# Patient Record
Sex: Female | Born: 1990 | Race: White | Hispanic: No | Marital: Single | Smoking: Current every day smoker
Health system: Southern US, Community
[De-identification: ages and names within clinical notes are randomized; demographics above are authoritative.]

## PROBLEM LIST (undated history)

## (undated) DIAGNOSIS — F32A Depression, unspecified: Secondary | ICD-10-CM

## (undated) DIAGNOSIS — Z973 Presence of spectacles and contact lenses: Secondary | ICD-10-CM

## (undated) DIAGNOSIS — G4700 Insomnia, unspecified: Secondary | ICD-10-CM

## (undated) DIAGNOSIS — Z862 Personal history of diseases of the blood and blood-forming organs and certain disorders involving the immune mechanism: Secondary | ICD-10-CM

## (undated) DIAGNOSIS — F419 Anxiety disorder, unspecified: Secondary | ICD-10-CM

## (undated) DIAGNOSIS — D369 Benign neoplasm, unspecified site: Secondary | ICD-10-CM

## (undated) DIAGNOSIS — F988 Other specified behavioral and emotional disorders with onset usually occurring in childhood and adolescence: Secondary | ICD-10-CM

## (undated) DIAGNOSIS — F99 Mental disorder, not otherwise specified: Secondary | ICD-10-CM

## (undated) HISTORY — DX: Mental disorder, not otherwise specified: F99

## (undated) HISTORY — PX: WISDOM TOOTH EXTRACTION: SHX21

## (undated) HISTORY — PX: RHINOPLASTY: SUR1284

---

## 2012-06-13 ENCOUNTER — Other Ambulatory Visit: Payer: Self-pay | Admitting: Orthopedic Surgery

## 2012-06-13 DIAGNOSIS — M2390 Unspecified internal derangement of unspecified knee: Secondary | ICD-10-CM

## 2012-06-17 ENCOUNTER — Ambulatory Visit
Admission: RE | Admit: 2012-06-17 | Discharge: 2012-06-17 | Disposition: A | Discharge status: Home / Self Care | Payer: BC Managed Care – PPO | Source: Ambulatory Visit | Attending: Orthopedic Surgery | Admitting: Orthopedic Surgery

## 2012-06-17 DIAGNOSIS — M2390 Unspecified internal derangement of unspecified knee: Secondary | ICD-10-CM

## 2015-06-14 ENCOUNTER — Encounter: Payer: Self-pay | Admitting: Emergency Medicine

## 2015-06-14 ENCOUNTER — Emergency Department (INDEPENDENT_AMBULATORY_CARE_PROVIDER_SITE_OTHER): Payer: Self-pay

## 2015-06-14 ENCOUNTER — Emergency Department
Admission: EM | Admit: 2015-06-14 | Discharge: 2015-06-14 | Disposition: A | Discharge status: Home / Self Care | Payer: Self-pay | Source: Home / Self Care | Attending: Family Medicine | Admitting: Family Medicine

## 2015-06-14 DIAGNOSIS — H04209 Unspecified epiphora, unspecified lacrimal gland: Secondary | ICD-10-CM

## 2015-06-14 DIAGNOSIS — R067 Sneezing: Secondary | ICD-10-CM

## 2015-06-14 DIAGNOSIS — R05 Cough: Secondary | ICD-10-CM

## 2015-06-14 DIAGNOSIS — J069 Acute upper respiratory infection, unspecified: Secondary | ICD-10-CM

## 2015-06-14 DIAGNOSIS — R059 Cough, unspecified: Secondary | ICD-10-CM

## 2015-06-14 MED ORDER — PREDNISONE 20 MG PO TABS: ORAL_TABLET | ORAL | Status: DC

## 2015-06-14 MED ORDER — BENZONATATE 100 MG PO CAPS: 100.0000 mg | ORAL_CAPSULE | Freq: Three times a day (TID) | ORAL | Status: DC

## 2015-06-14 MED ORDER — ALBUTEROL SULFATE HFA 108 (90 BASE) MCG/ACT IN AERS: 1.0000 | INHALATION_SPRAY | Freq: Four times a day (QID) | RESPIRATORY_TRACT | Status: DC | PRN

## 2015-06-14 MED ORDER — FLUTICASONE PROPIONATE 50 MCG/ACT NA SUSP: NASAL | Status: DC

## 2015-06-14 NOTE — ED Provider Notes (Signed)
CSN: [id number]     Arrival date & time 06/14/15  1216 History   First MD Initiated Contact with Patient 06/14/15 1244     Chief Complaint  Patient presents with  . Cough   (Consider location/radiation/quality/duration/timing/severity/associated sxs/prior Treatment) HPI  Pt is a 24yo female presenting to [facility] with c/o mild to moderately productive cough with associated fatigue, body aches, thick yellow-green nasal discharge, and left eye itching and irritation for 6 days.  Pt started getting sick on Thursday, 06/09/15, but symptoms worsened Saturday 06/11/15.  She was started on Amoxicillin but states she is not improving. 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  She also questions if her symptoms are allergic in nature but she has not tried zyrtec which she has taken in the past.  Denies fever, n/v/d. Denies chest pain or SOB. Denies hx of asthma.   History reviewed. No pertinent past medical history. History reviewed. No pertinent past surgical history. No family history on file. Social History  Substance Use Topics  . Smoking status: Never Smoker   . Smokeless tobacco: None  . Alcohol Use: Yes   OB History    No data available     Review of Systems  Constitutional: Positive for appetite change and fatigue. Negative for fever and chills.  HENT: Positive for congestion, rhinorrhea, sinus pressure, sore throat and voice change. Negative for ear pain and trouble swallowing.   Respiratory: Positive for cough. Negative for shortness of breath.   Cardiovascular: Negative for chest pain and palpitations.  Gastrointestinal: Negative for nausea, vomiting, abdominal pain and diarrhea.  Musculoskeletal: Negative for myalgias, back pain and arthralgias.  Skin: Negative for rash.  Neurological: Negative for dizziness, light-headedness and headaches.  All other systems reviewed and are negative.   Allergies   Review of patient's allergies indicates no known allergies.  Home Medications   Prior to Admission medications   Medication Sig Start Date End Date Taking? Authorizing Provider  ALPRAZolam Duanne Moron) 0.5 MG tablet Take 0.5 mg by mouth at bedtime as needed for anxiety.   Yes Historical Provider, MD  amphetamine-dextroamphetamine (ADDERALL) 20 MG tablet Take 20 mg by mouth daily.   Yes Historical Provider, MD  Pseudoeph-Doxylamine-DM-APAP (DAYQUIL/NYQUIL COLD/FLU RELIEF PO) Take by mouth.   Yes Historical Provider, MD  venlafaxine XR (EFFEXOR-XR) 150 MG 24 hr capsule Take 150 mg by mouth daily with breakfast.   Yes Historical Provider, MD  zolpidem (AMBIEN) 10 MG tablet Take 10 mg by mouth at bedtime as needed for sleep.   Yes Historical Provider, MD  albuterol (PROVENTIL HFA;VENTOLIN HFA) 108 (90 BASE) MCG/ACT inhaler Inhale 1-2 puffs into the lungs every 6 (six) hours as needed for wheezing or shortness of breath. 06/14/15   Noland Fordyce, PA-C  benzonatate (TESSALON) 100 MG capsule Take 1 capsule (100 mg total) by mouth every 8 (eight) hours. 06/14/15   Noland Fordyce, PA-C  fluticasone [facility]) 50 MCG/ACT nasal spray 1-2 sprays daily 06/14/15   Noland Fordyce, PA-C  predniSONE (DELTASONE) 20 MG tablet 3 tabs po day one, then 2 po daily x 4 days 06/14/15   Noland Fordyce, PA-C   Meds Ordered and Administered this Visit  Medications - No data to display  BP 134/96 mmHg  Pulse 101  Temp(Src) 98.3 F (36.8 C) (Oral)  Ht 5\' 6"  (1.676 m)  Wt 150 lb (68.04 kg)  BMI 24.22 kg/m2  SpO2 96%  LMP 04/14/2015 (Approximate) No data found.   Physical Exam  Constitutional: She appears well-developed and well-nourished. No distress.  HENT:  Head: Normocephalic and atraumatic.  Right Ear: Hearing, tympanic membrane, external ear and ear canal normal.  Left Ear: Hearing, tympanic membrane, external ear and ear canal normal.  Nose: Mucosal edema present. Right sinus exhibits no maxillary sinus  tenderness and no frontal sinus tenderness. Left sinus exhibits no maxillary sinus tenderness and no frontal sinus tenderness.  Mouth/Throat: Uvula is midline, oropharynx is clear and moist and mucous membranes are normal.  Frequent sneezing during exam  Eyes: Conjunctivae are normal. No scleral icterus.  Neck: Normal range of motion. Neck supple.  Cardiovascular: Normal rate, regular rhythm and normal heart sounds.   Pulmonary/Chest: Effort normal and breath sounds normal. No stridor. No respiratory distress. She has no wheezes. She has no rales. She exhibits no tenderness.  Intermittent mildly productive cough during exam.  Abdominal: Soft. She exhibits no distension and no mass. There is no tenderness. There is no rebound and no guarding.  Musculoskeletal: Normal range of motion.  Lymphadenopathy:    She has no cervical adenopathy.  Neurological: She is alert.  Skin: Skin is warm and dry. She is not diaphoretic.  Psychiatric: Her speech is normal. Her mood appears anxious ( mild).  Nursing note and vitals reviewed.   ED Course  Procedures (including critical care time)  Labs Review Labs Reviewed - No data to display  Imaging Review Dg Chest 2 View  06/14/2015  CLINICAL DATA:  24 year old female with 1 week of productive cough and hoarseness EXAM: CHEST  2 VIEW COMPARISON:  None. FINDINGS: The lungs are clear and negative for focal airspace consolidation, pulmonary edema or suspicious pulmonary nodule. No pleural effusion or pneumothorax. Cardiac and mediastinal contours are within normal limits. No acute fracture or lytic or blastic osseous lesions. The visualized upper abdominal bowel gas pattern is unremarkable. IMPRESSION: Negative chest x-ray. Electronically Signed   By: Jacqulynn Cadet M.D.   On: 06/14/2015 13:05      MDM   1. Acute upper respiratory infection   2. Sneezing with watery eyes   3. Cough    Pt c/o persistent cough despite starting amoxicillin 4 days ago.  Initial symptoms started 6 days ago.   Advised pt symptoms may be viral which is why antibiotic does not seem to be helping, however, may be an allergic component given the sneezing and watery eyes.  To help treat potential underlying bacterial infection and to help prevent bacterial infection, pt may continue to take amoxicillin, however, may also add Tessalon, Flonase, prednisone and albuterol inhaler.  F/u with PCP in 7-10 days if not improving, sooner if worsening.  Patient verbalized understanding and agreement with treatment plan.     Noland Fordyce, PA-C 06/14/15 1426

## 2015-06-14 NOTE — Discharge Instructions (Signed)
You may take 400-600mg  Ibuprofen (Motrin) every 6-8 hours for fever and pain  Alternate with Tylenol  You may take 500mg  Tylenol every 4-6 hours as needed for fever and pain  Follow-up with your primary care provider next week for recheck of symptoms if not improving.  Be sure to drink plenty of fluids and rest, at least 8hrs of sleep a night, preferably more while you are sick. Return urgent care or go to closest ER if you cannot keep down fluids/signs of dehydration, fever not reducing with Tylenol, difficulty breathing/wheezing, stiff neck, worsening condition, or other concerns (see below)  Be sure to complete the amoxicillin as prescribed and be sure to complete entire course even if you start to feel better to ensure infection does not come back.

## 2015-06-14 NOTE — ED Notes (Signed)
Productive cough, hoarseness, fatigue, body aches, heavy, thick yellow-green mucus, left eye irritated x 6 days- Has been taking amoxicillin for 4 days, not getting better

## 2016-02-01 ENCOUNTER — Emergency Department (HOSPITAL_COMMUNITY)
Admission: EM | Admit: 2016-02-01 | Discharge: 2016-02-02 | Disposition: A | Discharge status: Home / Self Care | Payer: Medicaid - Out of State | Attending: Emergency Medicine | Admitting: Emergency Medicine

## 2016-02-01 ENCOUNTER — Encounter (HOSPITAL_COMMUNITY): Payer: Self-pay | Admitting: Emergency Medicine

## 2016-02-01 DIAGNOSIS — K5900 Constipation, unspecified: Secondary | ICD-10-CM | POA: Diagnosis not present

## 2016-02-01 DIAGNOSIS — D270 Benign neoplasm of right ovary: Secondary | ICD-10-CM | POA: Diagnosis not present

## 2016-02-01 DIAGNOSIS — R1031 Right lower quadrant pain: Secondary | ICD-10-CM | POA: Diagnosis present

## 2016-02-01 DIAGNOSIS — Z79899 Other long term (current) drug therapy: Secondary | ICD-10-CM | POA: Insufficient documentation

## 2016-02-01 DIAGNOSIS — D369 Benign neoplasm, unspecified site: Secondary | ICD-10-CM

## 2016-02-01 LAB — COMPREHENSIVE METABOLIC PANEL
ALBUMIN: 4.3 g/dL (ref 3.5–5.0)
ALT: 22 U/L (ref 14–54)
AST: 25 U/L (ref 15–41)
Alkaline Phosphatase: 51 U/L (ref 38–126)
Anion gap: 8 (ref 5–15)
BUN: 11 mg/dL (ref 6–20)
CHLORIDE: 105 mmol/L (ref 101–111)
CO2: 21 mmol/L — AB (ref 22–32)
CREATININE: 0.66 mg/dL (ref 0.44–1.00)
Calcium: 8.7 mg/dL — ABNORMAL LOW (ref 8.9–10.3)
GFR calc Af Amer: >60 mL/min (ref >60)
GFR calc non Af Amer: >60 mL/min (ref >60)
Glucose, Bld: 83 mg/dL (ref 65–99)
Potassium: 3.6 mmol/L (ref 3.5–5.1)
SODIUM: 134 mmol/L — AB (ref 135–145)
Total Bilirubin: 0.4 mg/dL (ref 0.3–1.2)
Total Protein: 7.6 g/dL (ref 6.5–8.1)

## 2016-02-01 LAB — URINALYSIS, ROUTINE W REFLEX MICROSCOPIC
Bilirubin Urine: NEGATIVE
GLUCOSE, UA: NEGATIVE mg/dL
HGB URINE DIPSTICK: NEGATIVE
Ketones, ur: NEGATIVE mg/dL
Leukocytes, UA: NEGATIVE
Nitrite: NEGATIVE
PROTEIN: NEGATIVE mg/dL
Specific Gravity, Urine: 1.024 (ref 1.005–1.030)
pH: 5.5 (ref 5.0–8.0)

## 2016-02-01 LAB — CBC
HCT: 41.4 % (ref 36.0–46.0)
Hemoglobin: 13.2 g/dL (ref 12.0–15.0)
MCH: 26.7 pg (ref 26.0–34.0)
MCHC: 31.9 g/dL (ref 30.0–36.0)
MCV: 83.8 fL (ref 78.0–100.0)
PLATELETS: 327 10*3/uL (ref 150–400)
RBC: 4.94 MIL/uL (ref 3.87–5.11)
RDW: 13.6 % (ref 11.5–15.5)
WBC: 9.4 10*3/uL (ref 4.0–10.5)

## 2016-02-01 LAB — I-STAT BETA HCG BLOOD, ED (MC, WL, AP ONLY): I-stat hCG, quantitative: <5 m[IU]/mL (ref <5)

## 2016-02-01 LAB — LIPASE, BLOOD: LIPASE: 23 U/L (ref 11–51)

## 2016-02-01 NOTE — ED Provider Notes (Signed)
[facility] DEPT Provider Note   CSN: [id number] Arrival date & time: 02/01/16  2057     History   Chief Complaint Chief Complaint  Patient presents with  . Abdominal Pain    HPI Mary Kane is a 25 y.o. female.  HPI Mary Kane is a 25 y.o. female who presents to emergency department with complaint of abdominal pain. Patient states abdominal pain started 2 days ago. Pain is localized to right lower quadrant. She reports associated nausea, urinary frequency and urgency, constipation. She states she hasn't had a bowel movement in several days until today when she "pushed out some hard stool balls." She has tried to take MiraLAX yesterday and today which she states did not help. Patient denies any fever or chills. She denies any back pain. She denies any blood in her stool. She is able to eat and drink normally. No history of prior abdominal surgeries. She reports history of ovarian cysts but states this does not feel like it.  History reviewed. No pertinent past medical history.  There are no active problems to display for this patient.   Past Surgical History:  Procedure Laterality Date  . RHINOPLASTY      OB History    No data available       Home Medications    Prior to Admission medications   Medication Sig Start Date End Date Taking? Authorizing Provider  albuterol (PROVENTIL HFA;VENTOLIN HFA) 108 (90 BASE) MCG/ACT inhaler Inhale 1-2 puffs into the lungs every 6 (six) hours as needed for wheezing or shortness of breath. 06/14/15   Noland Fordyce, PA-C  ALPRAZolam Duanne Moron) 0.5 MG tablet Take 0.5 mg by mouth at bedtime as needed for anxiety.    Historical Provider, MD  amphetamine-dextroamphetamine (ADDERALL) 20 MG tablet Take 20 mg by mouth daily.    Historical Provider, MD  benzonatate (TESSALON) 100 MG capsule Take 1 capsule (100 mg total) by mouth every 8 (eight) hours. 06/14/15   Noland Fordyce, PA-C  fluticasone (FLONASE) 50 MCG/ACT nasal spray 1-2 sprays  daily 06/14/15   Noland Fordyce, PA-C  predniSONE (DELTASONE) 20 MG tablet 3 tabs po day one, then 2 po daily x 4 days 06/14/15   Noland Fordyce, PA-C  Pseudoeph-Doxylamine-DM-APAP (DAYQUIL/NYQUIL COLD/FLU RELIEF PO) Take by mouth.    Historical Provider, MD  venlafaxine XR (EFFEXOR-XR) 150 MG 24 hr capsule Take 150 mg by mouth daily with breakfast.    Historical Provider, MD  zolpidem (AMBIEN) 10 MG tablet Take 10 mg by mouth at bedtime as needed for sleep.    Historical Provider, MD    Family History History reviewed. No pertinent family history.  Social History Social History  Substance Use Topics  . Smoking status: Never Smoker  . Smokeless tobacco: Never Used  . Alcohol use Yes     Allergies   Review of patient's allergies indicates no known allergies.   Review of Systems Review of Systems  Constitutional: Negative for chills and fever.  Respiratory: Negative for cough, chest tightness and shortness of breath.   Cardiovascular: Negative for chest pain, palpitations and leg swelling.  Gastrointestinal: Positive for abdominal pain, constipation and nausea. Negative for blood in stool, diarrhea and vomiting.  Genitourinary: Positive for dysuria, frequency and urgency. Negative for flank pain, pelvic pain, vaginal bleeding, vaginal discharge and vaginal pain.  Musculoskeletal: Negative for arthralgias, myalgias, neck pain and neck stiffness.  Skin: Negative for rash.  Neurological: Negative for dizziness, weakness and headaches.  All other systems reviewed and are negative.  Physical Exam Updated Vital Signs BP 134/96 (BP Location: Left Arm)   Pulse 107   Temp 98.7 F (37.1 C) (Oral)   Resp 18   Ht 5\' 6"  (1.676 m)   Wt 65.8 kg   LMP 01/11/2016 (Approximate)   SpO2 98%   BMI 23.40 kg/m   Physical Exam  Constitutional: She appears well-developed and well-nourished. No distress.  HENT:  Head: Normocephalic.  Eyes: Conjunctivae are normal.  Neck: Neck supple.    Cardiovascular: Normal rate, regular rhythm and normal heart sounds.   Pulmonary/Chest: Effort normal and breath sounds normal. No respiratory distress. She has no wheezes. She has no rales.  Abdominal: Soft. Bowel sounds are normal. She exhibits no distension. There is tenderness. There is no rebound and no guarding.  RLQ tenderness  Musculoskeletal: She exhibits no edema.  Neurological: She is alert.  Skin: Skin is warm and dry.  Psychiatric: She has a normal mood and affect. Her behavior is normal.  Nursing note and vitals reviewed.    ED Treatments / Results  Labs (all labs ordered are listed, but only abnormal results are displayed) Labs Reviewed  COMPREHENSIVE METABOLIC PANEL - Abnormal; Notable for the following:       Result Value   Sodium 134 (*)    CO2 21 (*)    Calcium 8.7 (*)    All other components within normal limits  WET PREP, GENITAL  LIPASE, BLOOD  CBC  URINALYSIS, ROUTINE W REFLEX MICROSCOPIC (NOT AT [facility])  I-STAT BETA HCG BLOOD, ED ([facility], [facility], [facility] ONLY)  GC/CHLAMYDIA PROBE AMP ([place]) NOT AT [facility]    EKG  EKG Interpretation None       Radiology Ct Abdomen Pelvis W Contrast  Result Date: 02/02/2016 CLINICAL DATA:  Right lower quadrant pain, nausea, vomiting, and constipation for 4 days. Urinary frequency. White cell count 9.4. Negative pregnancy test. EXAM: CT ABDOMEN AND PELVIS WITH CONTRAST TECHNIQUE: Multidetector CT imaging of the abdomen and pelvis was performed using the standard protocol following bolus administration of intravenous contrast. CONTRAST:  119mL ISOVUE-300 IOPAMIDOL (ISOVUE-300) INJECTION 61% COMPARISON:  None. FINDINGS: The lung bases are clear. The liver, spleen, gallbladder, pancreas, adrenal glands, kidneys, abdominal aorta, inferior vena cava, and retroperitoneal lymph nodes are unremarkable. Stomach, small bowel, and colon are decompressed. Diffusely stool-filled colon. No free air or free fluid in the abdomen. Pelvis: Bladder  wall is not thickened. Complex fat containing lesion in the right ovary measuring 3.5 cm diameter consistent with a dermoid cyst. Left ovary is unremarkable. No free or loculated pelvic fluid collections. No pelvic lymphadenopathy. The appendix is normal. No destructive bone lesions. 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 No evidence of bowel obstruction or inflammation. Appendix is normal. Electronically Signed   By: Lucienne Capers M.D.   On: 02/02/2016 02:08    Procedures Procedures (including critical care time)  Medications Ordered in ED Medications - No data to display   Initial Impression / Assessment and Plan / ED Course  I have reviewed the triage vital signs and the nursing notes.  Pertinent labs & imaging results that were available during my care of the patient were reviewed by me and considered in my medical decision making (see chart for details).  Clinical Course  Comment By Time  Patient seen and examined. Patient with right lower quadrant pain for 2 days. She is afebrile, mildly tachycardic, otherwise nontoxic appearing. Will give IV fluids, will perform a pelvic exam, labs showing  normal white blood cell count, normal urinalysis, negative pregnancy test, negative LFTs and lipase. Jeannett Senior, PA-C 08/16 2355    CT ordered to RO appendicitis. CT showing persistent right dermoid cyst and constipation. Pt had similar finding on CT  On 09/13/15. At that time cyst was 3.9cm. Advised she will need close OB/GYN follow up. Pt understands. Given magnesium citrate to go home with. miralax daily. Discussed methods to prevent constipation in the future.   Vitals:   02/01/16 2101 02/01/16 2206 02/01/16 2207 02/02/16 0127  BP: 128/82 134/96  129/86  Pulse: 114 107  94  Resp: 20 18  16   Temp: 98.7 F (37.1 C) 98.7 F (37.1 C)    TempSrc: Oral Oral    SpO2: 99% 98%  100%  Weight: 65.8 kg  65.8 kg   Height: 5\' 6"  (1.676 m)  5\' 6"  (1.676 m)        Final Clinical Impressions(s) / ED Diagnoses   Final diagnoses:  Right lower quadrant abdominal pain  Dermoid cyst  Constipation, unspecified constipation type    New Prescriptions Discharge Medication List as of 02/02/2016  2:27 AM    START taking these medications   Details  naproxen (NAPROSYN) 500 MG tablet Take 1 tablet (500 mg total) by mouth 2 (two) times daily., Starting Thu 02/02/2016, Print         Jeannett Senior, PA-C 02/03/16 0132    Orlie Dakin, MD 02/06/16 [phone]

## 2016-02-01 NOTE — ED Triage Notes (Signed)
Pt is c/o right lower quadrant pain that started on Monday and it has been getting worse since  Pt has had nausea without vomiting  Pt states the pain is constant  Pt states she has been really constipated and is only able to pass small hard balls of poop  Pt states she also has urinary frequency

## 2016-02-02 ENCOUNTER — Encounter (HOSPITAL_COMMUNITY): Payer: Self-pay

## 2016-02-02 ENCOUNTER — Emergency Department (HOSPITAL_COMMUNITY): Payer: Medicaid - Out of State

## 2016-02-02 LAB — GC/CHLAMYDIA PROBE AMP (~~LOC~~) NOT AT ARMC
CHLAMYDIA, DNA PROBE: NEGATIVE
NEISSERIA GONORRHEA: NEGATIVE

## 2016-02-02 LAB — WET PREP, GENITAL
Clue Cells Wet Prep HPF POC: NONE SEEN
Sperm: NONE SEEN
Trich, Wet Prep: NONE SEEN
YEAST WET PREP: NONE SEEN

## 2016-02-02 MED ORDER — IOPAMIDOL (ISOVUE-300) INJECTION 61%
100.0000 mL | Freq: Once | INTRAVENOUS | Status: AC | PRN
  Administered 2016-02-02: 100 mL via INTRAVENOUS

## 2016-02-02 MED ORDER — NAPROXEN 500 MG PO TABS: 500.0000 mg | ORAL_TABLET | Freq: Two times a day (BID) | ORAL | 0 refills | Status: DC

## 2016-02-02 MED ORDER — MAGNESIUM CITRATE PO SOLN
1.0000 | Freq: Once | ORAL | Status: DC
  Filled 2016-02-02: qty 296

## 2016-02-02 NOTE — ED Notes (Signed)
Patient is alert and oriented x3.  She was given DC instructions and follow up visit instructions.  Patient gave verbal understanding. She was DC ambulatory under her own power to home.  V/S stable.  He was not showing any signs of distress on DC 

## 2016-02-02 NOTE — Discharge Instructions (Signed)
Take mag citrate on the way home or when you get home. You can also try an enema. Take MiraLAX daily until have daily bowel movements. Please follow with OB/GYN regarding your ovarian cyst.

## 2019-09-07 ENCOUNTER — Ambulatory Visit (HOSPITAL_COMMUNITY)
Admission: AD | Admit: 2019-09-07 | Discharge: 2019-09-07 | Disposition: A | Discharge status: Home / Self Care | Payer: BLUE CROSS/BLUE SHIELD | Attending: Psychiatry | Admitting: Psychiatry

## 2019-09-07 DIAGNOSIS — F331 Major depressive disorder, recurrent, moderate: Secondary | ICD-10-CM | POA: Diagnosis not present

## 2019-09-07 DIAGNOSIS — R45851 Suicidal ideations: Secondary | ICD-10-CM | POA: Diagnosis not present

## 2019-09-07 DIAGNOSIS — F419 Anxiety disorder, unspecified: Secondary | ICD-10-CM | POA: Insufficient documentation

## 2019-09-07 DIAGNOSIS — R450 Nervousness: Secondary | ICD-10-CM | POA: Insufficient documentation

## 2019-09-07 DIAGNOSIS — F4310 Post-traumatic stress disorder, unspecified: Secondary | ICD-10-CM | POA: Insufficient documentation

## 2019-09-07 DIAGNOSIS — G479 Sleep disorder, unspecified: Secondary | ICD-10-CM | POA: Insufficient documentation
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
## 2019-09-08 NOTE — H&P (Signed)
Behavioral Health Medical Screening Exam  Mary Kane is an 29 y.o. female.  Total Time spent with patient: 15 minutes  Psychiatric Specialty Exam: Physical Exam  Constitutional: She is oriented to person, place, and time. She appears well-developed and well-nourished. No distress.  HENT:  Head: Normocephalic and atraumatic.  Right Ear: External ear normal.  Left Ear: External ear normal.  Eyes: Right eye exhibits no discharge. Left eye exhibits no discharge.  Cardiovascular: Normal rate.  Respiratory: Effort normal. No respiratory distress.  Musculoskeletal:        General: Normal range of motion.  Neurological: She is alert and oriented to person, place, and time.  Skin: She is not diaphoretic.  Psychiatric: Her mood appears anxious. She is not withdrawn and not actively hallucinating. Thought content is not paranoid and not delusional. She exhibits a depressed mood. She expresses no homicidal and no suicidal ideation.    Review of Systems  Constitutional: Negative.   HENT: Negative.   Respiratory: Negative.   Cardiovascular: Negative.   Gastrointestinal: Negative.   Musculoskeletal: Negative.   Skin: Negative.   Neurological: Negative.   Psychiatric/Behavioral: Positive for decreased concentration, dysphoric mood, sleep disturbance and suicidal ideas. Negative for hallucinations and self-injury. The patient is nervous/anxious. The patient is not hyperactive.   All other systems reviewed and are negative.   Blood pressure 111/82, pulse 87, temperature 98.4 F (36.9 C), temperature source Oral, resp. rate 18, SpO2 100 %.There is no height or weight on file to calculate BMI.  General Appearance: Casual and Well Groomed  Eye Contact:  Good  Speech:  Clear and Coherent and Normal Rate  Volume:  Normal  Mood:  Anxious and Depressed  Affect:  Congruent  Thought Process:  Coherent, Goal Directed, Linear and Descriptions of Associations: Intact  Orientation:  Full (Time,  Place, and Person)  Thought Content:  Logical  Suicidal Thoughts:  No  Homicidal Thoughts:  No  Memory:  Immediate;   Good Recent;   Good Remote;   Good  Judgement:  Intact  Insight:  Fair  Psychomotor Activity:  Normal  Concentration: Concentration: Fair and Attention Span: Fair  Recall:  Good  Fund of Knowledge:Good  Language: Good  Akathisia:  Negative  Handed:  Right  AIMS (if indicated):     Assets:  Communication Skills Desire for Improvement Financial Resources/Insurance Housing Leisure Time Physical Health Resilience  Sleep:       Musculoskeletal: Strength & Muscle Tone: within normal limits Gait & Station: normal Patient leans: N/A  Blood pressure 111/82, pulse 87, temperature 98.4 F (36.9 C), temperature source Oral, resp. rate 18, SpO2 100 %.  Recommendations:  Based on my evaluation the patient does not appear to have an emergency medical condition.   Disposition: No evidence of imminent risk to self or others at present.   Patient does not meet criteria for psychiatric inpatient admission. Supportive therapy provided about ongoing stressors. Discussed crisis plan, support from social network, calling 911, coming to the Emergency Department, and calling Suicide Hotline. Patient has an appointment with her Psyhciatrist on Wednesday. TTS provided additional resources.   Rozetta Nunnery, NP 09/08/2019, 6:41 AM

## 2019-10-19 ENCOUNTER — Encounter: Payer: Self-pay | Admitting: Obstetrics and Gynecology

## 2019-10-19 ENCOUNTER — Other Ambulatory Visit: Payer: Self-pay

## 2019-10-19 ENCOUNTER — Other Ambulatory Visit (HOSPITAL_COMMUNITY)
Admission: RE | Admit: 2019-10-19 | Payer: BLUE CROSS/BLUE SHIELD | Source: Ambulatory Visit | Admitting: Obstetrics and Gynecology

## 2019-10-19 ENCOUNTER — Ambulatory Visit (INDEPENDENT_AMBULATORY_CARE_PROVIDER_SITE_OTHER): Payer: BLUE CROSS/BLUE SHIELD | Admitting: Obstetrics and Gynecology

## 2019-10-19 VITALS — BP 117/76 | HR 86 | Ht 66.0 in | Wt 148.0 lb

## 2019-10-19 DIAGNOSIS — D279 Benign neoplasm of unspecified ovary: Secondary | ICD-10-CM

## 2019-10-19 DIAGNOSIS — Z1272 Encounter for screening for malignant neoplasm of vagina: Secondary | ICD-10-CM

## 2019-10-19 DIAGNOSIS — Z01419 Encounter for gynecological examination (general) (routine) without abnormal findings: Secondary | ICD-10-CM | POA: Diagnosis not present

## 2019-10-19 MED ORDER — LO LOESTRIN FE 1 MG-10 MCG / 10 MCG PO TABS: 1.0000 | ORAL_TABLET | Freq: Every day | ORAL | 4 refills | Status: DC

## 2019-10-19 NOTE — Progress Notes (Signed)
Subjective:     Mary Kane is a 29 y.o. female with BMI 23 who is here for a comprehensive physical exam. The patient reports no problems. Patient reports a monthly light period. She is not currently sexually active and desires STI testing. Patient suffers from depression and admits to suicidal ideations in the days preceding her cycle. This has taken place with the change of her COC. Patient desires to change to previous COC. Patient diagnosed with a dermoid cyst in 2017 without follow up.Patient is otherwise without complaints.   Past Medical History:  Diagnosis Date  . Mental disorder    Past Surgical History:  Procedure Laterality Date  . RHINOPLASTY     Family History  Problem Relation Age of Onset  . Chromosomal disorder Father   . COPD Father    Social History   Socioeconomic History  . Marital status: Single    Spouse name: Not on file  . Number of children: Not on file  . Years of education: Not on file  . Highest education level: Not on file  Occupational History  . Not on file  Tobacco Use  . Smoking status: Current Every Day Smoker  . Smokeless tobacco: Never Used  Substance and Sexual Activity  . Alcohol use: Yes  . Drug use: Yes    Types: Marijuana  . Sexual activity: Not Currently    Birth control/protection: Pill  Other Topics Concern  . Not on file  Social History Narrative  . Not on file   Social Determinants of Health   Financial Resource Strain:   . Difficulty of Paying Living Expenses:   Food Insecurity:   . Worried About Charity fundraiser in the Last Year:   . Arboriculturist in the Last Year:   Transportation Needs:   . Film/video editor (Medical):   Marland Kitchen Lack of Transportation (Non-Medical):   Physical Activity:   . Days of Exercise per Week:   . Minutes of Exercise per Session:   Stress:   . Feeling of Stress :   Social Connections:   . Frequency of Communication with Friends and Family:   . Frequency of Social Gatherings with  Friends and Family:   . Attends Religious Services:   . Active Member of Clubs or Organizations:   . Attends Archivist Meetings:   Marland Kitchen Marital Status:   Intimate Partner Violence:   . Fear of Current or Ex-Partner:   . Emotionally Abused:   Marland Kitchen Physically Abused:   . Sexually Abused:    Health Maintenance  Topic Date Due  . HIV Screening  Never done  . PAP SMEAR-Modifier  Never done  . INFLUENZA VACCINE  01/17/2020  . TETANUS/TDAP  06/09/2029       Review of Systems Pertinent items noted in HPI and remainder of comprehensive ROS otherwise negative.   Objective:  Blood pressure 117/76, pulse 86, height 5\' 6"  (1.676 m), weight 148 lb (67.1 kg), last menstrual period 10/02/2019.     GENERAL: Well-developed, well-nourished female in no acute distress.  HEENT: Normocephalic, atraumatic. Sclerae anicteric.  NECK: Supple. Normal thyroid.  LUNGS: Clear to auscultation bilaterally.  HEART: Regular rate and rhythm. BREASTS: Symmetric in size. No palpable masses or lymphadenopathy, skin changes, or nipple drainage. ABDOMEN: Soft, nontender, nondistended. No organomegaly. PELVIC: Normal external female genitalia. Vagina is pink and rugated.  Normal discharge. Normal appearing cervix. Uterus is normal in size. No adnexal mass or tenderness. EXTREMITIES: No cyanosis, clubbing, or  edema, 2+ distal pulses.    Assessment:    Healthy female exam.      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

## 2019-10-20 LAB — CERVICOVAGINAL ANCILLARY ONLY
Bacterial Vaginitis (gardnerella): NEGATIVE
Candida Glabrata: NEGATIVE
Candida Vaginitis: POSITIVE — AB
Chlamydia: NEGATIVE
Comment: NEGATIVE
Comment: NEGATIVE
Comment: NEGATIVE
Comment: NEGATIVE
Comment: NEGATIVE
Comment: NORMAL
Neisseria Gonorrhea: NEGATIVE
Trichomonas: NEGATIVE

## 2019-10-20 LAB — HIV ANTIBODY (ROUTINE TESTING W REFLEX): HIV Screen 4th Generation wRfx: NONREACTIVE

## 2019-10-20 LAB — RPR: RPR Ser Ql: NONREACTIVE

## 2019-10-20 LAB — HEPATITIS B SURFACE ANTIGEN: Hepatitis B Surface Ag: NEGATIVE

## 2019-10-20 LAB — HEPATITIS C ANTIBODY: Hep C Virus Ab: <0.1 s/co ratio (ref 0.0–0.9)

## 2019-10-21 LAB — CYTOLOGY - PAP
Comment: NEGATIVE
Diagnosis: NEGATIVE
High risk HPV: NEGATIVE

## 2019-10-21 MED ORDER — FLUCONAZOLE 150 MG PO TABS: 150.0000 mg | ORAL_TABLET | Freq: Once | ORAL | 0 refills | Status: AC

## 2019-10-21 NOTE — Addendum Note (Signed)
Addended by: Mora Bellman on: 10/21/2019 02:03 PM   Modules accepted: Orders

## 2019-10-27 ENCOUNTER — Other Ambulatory Visit: Payer: Self-pay

## 2019-10-27 ENCOUNTER — Ambulatory Visit
Admission: RE | Admit: 2019-10-27 | Discharge: 2019-10-27 | Disposition: A | Discharge status: Home / Self Care | Payer: BLUE CROSS/BLUE SHIELD | Source: Ambulatory Visit | Attending: Obstetrics and Gynecology | Admitting: Obstetrics and Gynecology

## 2019-10-27 DIAGNOSIS — D279 Benign neoplasm of unspecified ovary: Secondary | ICD-10-CM | POA: Diagnosis not present

## 2019-11-18 ENCOUNTER — Other Ambulatory Visit: Payer: Self-pay

## 2019-11-18 ENCOUNTER — Ambulatory Visit (INDEPENDENT_AMBULATORY_CARE_PROVIDER_SITE_OTHER): Payer: BLUE CROSS/BLUE SHIELD | Admitting: Obstetrics and Gynecology

## 2019-11-18 ENCOUNTER — Encounter: Payer: Self-pay | Admitting: Obstetrics and Gynecology

## 2019-11-18 VITALS — BP 112/78 | HR 96 | Wt 147.7 lb

## 2019-11-18 DIAGNOSIS — D270 Benign neoplasm of right ovary: Secondary | ICD-10-CM | POA: Diagnosis not present

## 2019-11-18 DIAGNOSIS — D3161 Benign neoplasm of unspecified site of right orbit: Secondary | ICD-10-CM

## 2019-11-18 NOTE — Progress Notes (Signed)
28 yo P0 here to discuss ultrasound results. Patient reports feeling well with some occasional fullness in her right pelvis. She reviewed ultrasound report and is ready for surgical intervention  Past Medical History:  Diagnosis Date  . Mental disorder    Past Surgical History:  Procedure Laterality Date  . RHINOPLASTY     Family History  Problem Relation Age of Onset  . Chromosomal disorder Father   . COPD Father    Social History   Tobacco Use  . Smoking status: Current Every Day Smoker  . Smokeless tobacco: Never Used  Substance Use Topics  . Alcohol use: Yes  . Drug use: Yes    Types: Marijuana   ROS See pertinent in HPI  Blood pressure 112/78, pulse 96, weight 147 lb 11.2 oz (67 kg), last menstrual period 11/04/2019.  GENERAL: Well-developed, well-nourished female in no acute distress.  LUNGS: Clear to auscultation bilaterally.  HEART: Regular rate and rhythm. ABDOMEN: Soft, nontender, nondistended. No organomegaly. PELVIC: Not performed EXTREMITIES: No cyanosis, clubbing, or edema, 2+ distal pulses.  US PELVIC COMPLETE WITH TRANSVAGINAL  Result Date: 10/27/2019 CLINICAL DATA:  Right ovarian dermoid cyst EXAM: TRANSABDOMINAL AND TRANSVAGINAL ULTRASOUND OF PELVIS TECHNIQUE: Both transabdominal and transvaginal ultrasound examinations of the pelvis were performed. Transabdominal technique was performed for global imaging of the pelvis including uterus, ovaries, adnexal regions, and pelvic cul-de-sac. It was necessary to proceed with endovaginal exam following the transabdominal exam to visualize the uterus, endometrium, ovaries and adnexa. COMPARISON:  None FINDINGS: Uterus Measurements: 6.7 x 2.3 x 3.6 cm = volume: 30 mL. No fibroids or other mass visualized. Endometrium Thickness: 3 mm in thickness.  No focal abnormality visualized. Right ovary Measurements: Nearly entire right ovary appears replaced with echogenic mass measuring 7.5 x 6.8 x 4.1 cm compatible with dermoid.  Left ovary Measurements: 3.2 x 2.6 x 2.4 cm = volume: 10.8 mL. Normal appearance/no adnexal mass. Other findings No free fluid. IMPRESSION: Echogenic mass within the right ovary measuring up to 7.5 cm compatible with dermoid cyst. This has enlarged since prior CT. Electronically Signed   By: Rolm Baptise M.D.   On: 10/27/2019 20:00   A/P 29 yo with right dermoid cyst - Discussed surgical management with laparoscopic ovarian cystectomy. Risks, benefits and alternatives were explained including but not limited to risks of bleeding, infection and damage to adjacent organs. Patient verbalized understanding and all questions were answered - Patient will be scheduled for lsc ovarian cystectomy

## 2019-11-18 NOTE — Progress Notes (Signed)
Pt presents to discuss u/s: R ovarian dermoid cyst

## 2019-12-22 ENCOUNTER — Encounter (HOSPITAL_BASED_OUTPATIENT_CLINIC_OR_DEPARTMENT_OTHER): Payer: Self-pay | Admitting: Obstetrics and Gynecology

## 2019-12-23 ENCOUNTER — Other Ambulatory Visit: Payer: Self-pay

## 2019-12-23 ENCOUNTER — Encounter (HOSPITAL_BASED_OUTPATIENT_CLINIC_OR_DEPARTMENT_OTHER): Payer: Self-pay | Admitting: Obstetrics and Gynecology

## 2019-12-23 NOTE — Progress Notes (Signed)
NEW Covid Policy July 2836  Surgery Day:    Facility:  [facility]  Type of Surgery: 12/29/2019  Fully Covid Vaccinated:   1) 08/26/2019                                          2) 09/23/2019                                          Where?                                           Type? Moderna   Do you have symptoms? No  In the past 14 days:        Have you had any symptoms? No        Have you been tested covid positive? No       Have you been in contact with someone covid positive? No        Is pt Immuno-compromised? No

## 2019-12-23 NOTE — Progress Notes (Signed)
Spoke with: Mary Kane NPO:   No food after midnight/Clear liquids until 10:30 AM DOS Arrival time: 1130 AM  Lab needs dos----   UPT            Lab results------CBC, T&S COVID test ------No fully vaccinated on  09/23/2019, patient instructed to bring card Medications to take morning of surgery: Duloxetine Pre op orders in epic Yes Diabetic medication -----N/A Patient Special Instructions -----N/A Pre-Op special Istructions -----N/A Ride home: Cecille Rubin (mom) [phone] or Luiz Ochoa (dad) [phone]  Patient verbalized understanding of instructions that were given at this phone interview. Patient denies shortness of breath, chest pain, fever, cough a this phone interview.

## 2019-12-24 ENCOUNTER — Encounter (HOSPITAL_COMMUNITY)
Admission: RE | Admit: 2019-12-24 | Discharge: 2019-12-24 | Disposition: A | Discharge status: Home / Self Care | Payer: BLUE CROSS/BLUE SHIELD | Source: Ambulatory Visit | Attending: Obstetrics and Gynecology | Admitting: Obstetrics and Gynecology

## 2019-12-24 DIAGNOSIS — Z01812 Encounter for preprocedural laboratory examination: Secondary | ICD-10-CM | POA: Diagnosis not present

## 2019-12-24 LAB — CBC
HCT: 44.2 % (ref 36.0–46.0)
Hemoglobin: 14 g/dL (ref 12.0–15.0)
MCH: 27.8 pg (ref 26.0–34.0)
MCHC: 31.7 g/dL (ref 30.0–36.0)
MCV: 87.7 fL (ref 80.0–100.0)
Platelets: 331 10*3/uL (ref 150–400)
RBC: 5.04 MIL/uL (ref 3.87–5.11)
RDW: 13.4 % (ref 11.5–15.5)
WBC: 10.1 10*3/uL (ref 4.0–10.5)
nRBC: 0 % (ref 0.0–0.2)

## 2019-12-28 NOTE — Anesthesia Preprocedure Evaluation (Addendum)
Anesthesia Evaluation  Patient identified by MRN, date of birth, ID band Patient awake    Reviewed: Allergy & Precautions, NPO status , Patient's Chart, lab work & pertinent test results  Airway Mallampati: II  TM Distance: >3 FB Neck ROM: Full    Dental no notable dental hx. (+) Teeth Intact, Dental Advisory Given   Pulmonary Current Smoker and Patient abstained from smoking.,    Pulmonary exam normal breath sounds clear to auscultation       Cardiovascular Exercise Tolerance: Good Normal cardiovascular exam Rhythm:Regular Rate:Normal     Neuro/Psych PSYCHIATRIC DISORDERS Anxiety Depression negative neurological ROS     GI/Hepatic negative GI ROS, Neg liver ROS,   Endo/Other  negative endocrine ROS  Renal/GU negative Renal ROS     Musculoskeletal negative musculoskeletal ROS (+)   Abdominal Normal abdominal exam  (+)   Peds  Hematology negative hematology ROS (+) Lab Results      Component                Value               Date                      WBC                      10.1                12/24/2019                HGB                      14.0                12/24/2019                HCT                      44.2                12/24/2019                MCV                      87.7                12/24/2019                PLT                      331                 12/24/2019              Anesthesia Other Findings   Reproductive/Obstetrics                           Anesthesia Physical Anesthesia Plan  ASA: I  Anesthesia Plan: General   Post-op Pain Management:    Induction: Intravenous  PONV Risk Score and Plan: 3 and Treatment may vary due to age or medical condition, Ondansetron, Dexamethasone and Midazolam  Airway Management Planned: Oral ETT  Additional Equipment: None  Intra-op Plan:   Post-operative Plan: Extubation in OR  Informed Consent: I have reviewed the  patients History and Physical, chart, labs and discussed the procedure including the risks, benefits  and alternatives for the proposed anesthesia with the patient or authorized representative who has indicated his/her understanding and acceptance.     Dental advisory given  Plan Discussed with: CRNA  Anesthesia Plan Comments:       Anesthesia Quick Evaluation

## 2019-12-29 ENCOUNTER — Other Ambulatory Visit: Payer: Self-pay

## 2019-12-29 ENCOUNTER — Encounter (HOSPITAL_BASED_OUTPATIENT_CLINIC_OR_DEPARTMENT_OTHER): Payer: Self-pay | Admitting: Obstetrics and Gynecology

## 2019-12-29 ENCOUNTER — Ambulatory Visit (HOSPITAL_BASED_OUTPATIENT_CLINIC_OR_DEPARTMENT_OTHER)
Admission: RE | Admit: 2019-12-29 | Discharge: 2019-12-29 | Disposition: A | Discharge status: Home / Self Care | Payer: BLUE CROSS/BLUE SHIELD | Attending: Obstetrics and Gynecology | Admitting: Obstetrics and Gynecology

## 2019-12-29 ENCOUNTER — Ambulatory Visit (HOSPITAL_BASED_OUTPATIENT_CLINIC_OR_DEPARTMENT_OTHER): Payer: BLUE CROSS/BLUE SHIELD | Admitting: Anesthesiology

## 2019-12-29 ENCOUNTER — Encounter (HOSPITAL_BASED_OUTPATIENT_CLINIC_OR_DEPARTMENT_OTHER)
Admission: RE | Disposition: A | Discharge status: Home / Self Care | Payer: Self-pay | Source: Home / Self Care | Attending: Obstetrics and Gynecology

## 2019-12-29 DIAGNOSIS — Z79899 Other long term (current) drug therapy: Secondary | ICD-10-CM | POA: Insufficient documentation

## 2019-12-29 DIAGNOSIS — G4700 Insomnia, unspecified: Secondary | ICD-10-CM | POA: Diagnosis not present

## 2019-12-29 DIAGNOSIS — D270 Benign neoplasm of right ovary: Secondary | ICD-10-CM | POA: Diagnosis not present

## 2019-12-29 DIAGNOSIS — F17210 Nicotine dependence, cigarettes, uncomplicated: Secondary | ICD-10-CM | POA: Insufficient documentation

## 2019-12-29 DIAGNOSIS — Z793 Long term (current) use of hormonal contraceptives: Secondary | ICD-10-CM | POA: Diagnosis not present

## 2019-12-29 DIAGNOSIS — F988 Other specified behavioral and emotional disorders with onset usually occurring in childhood and adolescence: Secondary | ICD-10-CM | POA: Insufficient documentation

## 2019-12-29 DIAGNOSIS — F329 Major depressive disorder, single episode, unspecified: Secondary | ICD-10-CM | POA: Insufficient documentation

## 2019-12-29 DIAGNOSIS — F419 Anxiety disorder, unspecified: Secondary | ICD-10-CM | POA: Diagnosis not present

## 2019-12-29 HISTORY — PX: LAPAROSCOPIC OVARIAN CYSTECTOMY: SHX6248

## 2019-12-29 HISTORY — DX: Personal history of diseases of the blood and blood-forming organs and certain disorders involving the immune mechanism: Z86.2

## 2019-12-29 HISTORY — DX: Anxiety disorder, unspecified: F41.9

## 2019-12-29 HISTORY — DX: Depression, unspecified: F32.A

## 2019-12-29 HISTORY — DX: Other specified behavioral and emotional disorders with onset usually occurring in childhood and adolescence: F98.8

## 2019-12-29 HISTORY — DX: Insomnia, unspecified: G47.00

## 2019-12-29 HISTORY — DX: Presence of spectacles and contact lenses: Z97.3

## 2019-12-29 HISTORY — DX: Benign neoplasm, unspecified site: D36.9

## 2019-12-29 LAB — ABO/RH: ABO/RH(D): O POS

## 2019-12-29 LAB — TYPE AND SCREEN
ABO/RH(D): O POS
Antibody Screen: NEGATIVE

## 2019-12-29 LAB — POCT PREGNANCY, URINE
Preg Test, Ur: NEGATIVE
Preg Test, Ur: NEGATIVE

## 2019-12-29 SURGERY — EXCISION, CYST, OVARY, LAPAROSCOPIC
Anesthesia: General | Laterality: Right

## 2019-12-29 MED ORDER — KETOROLAC TROMETHAMINE 30 MG/ML IJ SOLN
INTRAMUSCULAR | Status: DC | PRN
  Administered 2019-12-29: 30 mg via INTRAVENOUS

## 2019-12-29 MED ORDER — FENTANYL CITRATE (PF) 250 MCG/5ML IJ SOLN
INTRAMUSCULAR | Status: DC | PRN
  Administered 2019-12-29: 50 ug via INTRAVENOUS
  Administered 2019-12-29: 100 ug via INTRAVENOUS
  Administered 2019-12-29: 50 ug via INTRAVENOUS

## 2019-12-29 MED ORDER — MIDAZOLAM HCL 5 MG/5ML IJ SOLN
INTRAMUSCULAR | Status: DC | PRN
  Administered 2019-12-29: 2 mg via INTRAVENOUS

## 2019-12-29 MED ORDER — EPINEPHRINE 1 MG/10ML IJ SOSY
PREFILLED_SYRINGE | INTRAMUSCULAR | Status: AC
  Filled 2019-12-29: qty 20

## 2019-12-29 MED ORDER — DEXAMETHASONE SODIUM PHOSPHATE 10 MG/ML IJ SOLN
INTRAMUSCULAR | Status: AC
  Filled 2019-12-29: qty 1

## 2019-12-29 MED ORDER — DOCUSATE SODIUM 100 MG PO CAPS
100.0000 mg | ORAL_CAPSULE | Freq: Two times a day (BID) | ORAL | 2 refills | Status: DC | PRN
Start: 2019-12-29 — End: 2020-03-26

## 2019-12-29 MED ORDER — ATROPINE SULFATE 1 MG/10ML IJ SOSY
PREFILLED_SYRINGE | INTRAMUSCULAR | Status: AC
  Filled 2019-12-29: qty 20

## 2019-12-29 MED ORDER — FENTANYL CITRATE (PF) 100 MCG/2ML IJ SOLN
INTRAMUSCULAR | Status: AC
  Filled 2019-12-29: qty 2

## 2019-12-29 MED ORDER — LACTATED RINGERS IV SOLN: INTRAVENOUS | Status: DC

## 2019-12-29 MED ORDER — ONDANSETRON HCL 4 MG/2ML IJ SOLN
INTRAMUSCULAR | Status: AC
  Filled 2019-12-29: qty 2

## 2019-12-29 MED ORDER — ACETAMINOPHEN 500 MG PO TABS
1000.0000 mg | ORAL_TABLET | Freq: Once | ORAL | Status: AC
  Administered 2019-12-29: 1000 mg via ORAL

## 2019-12-29 MED ORDER — DEXAMETHASONE SODIUM PHOSPHATE 10 MG/ML IJ SOLN
INTRAMUSCULAR | Status: DC | PRN
Start: 2019-12-29 — End: 2019-12-29
  Administered 2019-12-29: 10 mg via INTRAVENOUS

## 2019-12-29 MED ORDER — HYDROMORPHONE HCL 1 MG/ML IJ SOLN
INTRAMUSCULAR | Status: AC
  Filled 2019-12-29: qty 1

## 2019-12-29 MED ORDER — ACETAMINOPHEN 500 MG PO TABS
ORAL_TABLET | ORAL | Status: AC
  Filled 2019-12-29: qty 2

## 2019-12-29 MED ORDER — ROCURONIUM BROMIDE 10 MG/ML (PF) SYRINGE
PREFILLED_SYRINGE | INTRAVENOUS | Status: DC | PRN
  Administered 2019-12-29: 60 mg via INTRAVENOUS

## 2019-12-29 MED ORDER — LIDOCAINE 2% (20 MG/ML) 5 ML SYRINGE
INTRAMUSCULAR | Status: DC | PRN
  Administered 2019-12-29: 100 mg via INTRAVENOUS

## 2019-12-29 MED ORDER — PROPOFOL 10 MG/ML IV BOLUS
INTRAVENOUS | Status: DC | PRN
  Administered 2019-12-29: 200 mg via INTRAVENOUS

## 2019-12-29 MED ORDER — SUGAMMADEX SODIUM 200 MG/2ML IV SOLN
INTRAVENOUS | Status: DC | PRN
  Administered 2019-12-29: 240 mg via INTRAVENOUS

## 2019-12-29 MED ORDER — MIDAZOLAM HCL 2 MG/2ML IJ SOLN
INTRAMUSCULAR | Status: AC
  Filled 2019-12-29: qty 2

## 2019-12-29 MED ORDER — OXYCODONE HCL 5 MG/5ML PO SOLN: 5.0000 mg | Freq: Once | ORAL | Status: DC | PRN

## 2019-12-29 MED ORDER — SODIUM CHLORIDE 0.9 % IR SOLN
Status: DC | PRN
  Administered 2019-12-29: 1000 mL

## 2019-12-29 MED ORDER — KETOROLAC TROMETHAMINE 30 MG/ML IJ SOLN
INTRAMUSCULAR | Status: AC
  Filled 2019-12-29: qty 1

## 2019-12-29 MED ORDER — PROPOFOL 10 MG/ML IV BOLUS
INTRAVENOUS | Status: AC
  Filled 2019-12-29: qty 20

## 2019-12-29 MED ORDER — HYDROMORPHONE HCL 1 MG/ML IJ SOLN
0.2500 mg | INTRAMUSCULAR | Status: DC | PRN
  Administered 2019-12-29 (×3): 0.5 mg via INTRAVENOUS

## 2019-12-29 MED ORDER — ACETAMINOPHEN 10 MG/ML IV SOLN: 1000.0000 mg | Freq: Once | INTRAVENOUS | Status: DC | PRN

## 2019-12-29 MED ORDER — HYDROCODONE-ACETAMINOPHEN 5-300 MG PO TABS: 1.0000 | ORAL_TABLET | ORAL | 0 refills | Status: DC | PRN

## 2019-12-29 MED ORDER — IBUPROFEN 600 MG PO TABS
600.0000 mg | ORAL_TABLET | Freq: Four times a day (QID) | ORAL | 3 refills | Status: DC | PRN
Start: 2019-12-29 — End: 2020-03-26

## 2019-12-29 MED ORDER — OXYCODONE HCL 5 MG PO TABS: 5.0000 mg | ORAL_TABLET | Freq: Once | ORAL | Status: DC | PRN

## 2019-12-29 MED ORDER — LIDOCAINE 2% (20 MG/ML) 5 ML SYRINGE
INTRAMUSCULAR | Status: AC
  Filled 2019-12-29: qty 5

## 2019-12-29 MED ORDER — BUPIVACAINE HCL (PF) 0.25 % IJ SOLN
INTRAMUSCULAR | Status: AC
  Filled 2019-12-29: qty 30

## 2019-12-29 MED ORDER — ROCURONIUM BROMIDE 10 MG/ML (PF) SYRINGE
PREFILLED_SYRINGE | INTRAVENOUS | Status: AC
  Filled 2019-12-29: qty 10

## 2019-12-29 MED ORDER — BUPIVACAINE HCL (PF) 0.25 % IJ SOLN
INTRAMUSCULAR | Status: DC | PRN
  Administered 2019-12-29: 30 mL

## 2019-12-29 MED ORDER — PROMETHAZINE HCL 25 MG/ML IJ SOLN: 6.2500 mg | INTRAMUSCULAR | Status: DC | PRN

## 2019-12-29 MED ORDER — ONDANSETRON HCL 4 MG/2ML IJ SOLN
INTRAMUSCULAR | Status: DC | PRN
  Administered 2019-12-29: 4 mg via INTRAVENOUS

## 2019-12-29 SURGICAL SUPPLY — 35 items
APPLICATOR ARISTA FLEXITIP XL (MISCELLANEOUS) IMPLANT
COVER WAND RF STERILE (DRAPES) ×2 IMPLANT
DERMABOND ADVANCED (GAUZE/BANDAGES/DRESSINGS) ×1
DERMABOND ADVANCED .7 DNX12 (GAUZE/BANDAGES/DRESSINGS) ×1 IMPLANT
DRSG OPSITE POSTOP 3X4 (GAUZE/BANDAGES/DRESSINGS) ×2 IMPLANT
DURAPREP 26ML APPLICATOR (WOUND CARE) ×2 IMPLANT
ELECT REM PT RETURN 9FT ADLT (ELECTROSURGICAL)
ELECTRODE REM PT RTRN 9FT ADLT (ELECTROSURGICAL) IMPLANT
GAUZE 4X4 16PLY RFD (DISPOSABLE) ×2 IMPLANT
GLOVE BIOGEL PI IND STRL 6.5 (GLOVE) ×2 IMPLANT
GLOVE BIOGEL PI IND STRL 7.0 (GLOVE) ×2 IMPLANT
GLOVE BIOGEL PI INDICATOR 6.5 (GLOVE) ×2
GLOVE BIOGEL PI INDICATOR 7.0 (GLOVE) ×2
GLOVE SURG SS PI 6.0 STRL IVOR (GLOVE) ×2 IMPLANT
GLOVE SURG SS PI 6.5 STRL IVOR (GLOVE) ×2 IMPLANT
GOWN STRL REUS W/TWL LRG LVL3 (GOWN DISPOSABLE) ×4 IMPLANT
HEMOSTAT ARISTA ABSORB 3G PWDR (HEMOSTASIS) IMPLANT
NS IRRIG 1000ML POUR BTL (IV SOLUTION) IMPLANT
PACK LAPAROSCOPY BASIN (CUSTOM PROCEDURE TRAY) ×2 IMPLANT
PACK TRENDGUARD 450 HYBRID PRO (MISCELLANEOUS) ×1 IMPLANT
POUCH SPECIMEN RETRIEVAL 10MM (ENDOMECHANICALS) ×2 IMPLANT
PROTECTOR NERVE ULNAR (MISCELLANEOUS) ×4 IMPLANT
SET SUCTION IRRIG HYDROSURG (IRRIGATION / IRRIGATOR) IMPLANT
SET TRI-LUMEN FLTR TB AIRSEAL (TUBING) ×2 IMPLANT
SHEARS HARMONIC ACE PLUS 36CM (ENDOMECHANICALS) ×2 IMPLANT
SUT MNCRL AB 4-0 PS2 18 (SUTURE) ×2 IMPLANT
SUT VICRYL 0 UR6 27IN ABS (SUTURE) ×4 IMPLANT
TOWEL OR 17X26 10 PK STRL BLUE (TOWEL DISPOSABLE) ×2 IMPLANT
TRAY FOLEY W/BAG SLVR 14FR (SET/KITS/TRAYS/PACK) ×2 IMPLANT
TRENDGUARD 450 HYBRID PRO PACK (MISCELLANEOUS) ×2
TROCAR BALLN 12MMX100 BLUNT (TROCAR) ×2 IMPLANT
TROCAR BLADELESS OPT 5 100 (ENDOMECHANICALS) ×4 IMPLANT
TUBE CONNECTING 12X1/4 (SUCTIONS) ×2 IMPLANT
WARMER LAPAROSCOPE (MISCELLANEOUS) ×2 IMPLANT
YANKAUER SUCT BULB TIP NO VENT (SUCTIONS) ×2 IMPLANT

## 2019-12-29 NOTE — H&P (Signed)
Mary Kane is an 29 y.o. female P0 here for scheduled surgical intervention for the management of a right dermoid cyst. Patient is doing well without complaints today. She reports a history of intermittent right lower quadrant pain. Patient is not currently sexually active and is using [place] for contraception  Pertinent Gynecological History: Menses: regular every month without intermenstrual spotting Contraception: OCP (estrogen/progesterone) DES exposure: denies Blood transfusions: none Sexually transmitted diseases: no past history Last pap: normal Date: 10/2019 OB History: G0, P0   Menstrual History: Patient's last menstrual period was 11/23/2019 (approximate).    Past Medical History:  Diagnosis Date  . ADD (attention deficit disorder)   . Anxiety   . Depression   . Dermoid cyst   . History of iron deficiency anemia   . Insomnia   . Mental disorder   . Wears contact lenses   . Wears glasses     Past Surgical History:  Procedure Laterality Date  . RHINOPLASTY    . WISDOM TOOTH EXTRACTION      Family History  Problem Relation Age of Onset  . Chromosomal disorder Father   . COPD Father     Social History:  reports that she has been smoking cigarettes. She has a 1.00 pack-year smoking history. She has never used smokeless tobacco. She reports current alcohol use. She reports current drug use. Drug: Marijuana.  Allergies: No Known Allergies  Medications Prior to Admission  Medication Sig Dispense Refill Last Dose  . ALPRAZolam (XANAX) 0.5 MG tablet Take by mouth every evening.    12/28/2019 at Unknown time  . Cholecalciferol (VITAMIN D3) 125 MCG (5000 UT) CAPS Take by mouth every morning.   12/28/2019 at Unknown time  . DULoxetine (CYMBALTA) 60 MG capsule Take 120 mg by mouth every morning.    12/29/2019 at 1000  . LO LOESTRIN FE 1 MG-10 MCG / 10 MCG tablet Take 1 tablet by mouth daily. 3 Package 4 12/28/2019 at Unknown time  . magnesium gluconate (MAGONATE) 500 MG  tablet Take 500 mg by mouth every morning.   12/28/2019 at Unknown time  . QUEtiapine (SEROQUEL) 50 MG tablet Take 50 mg by mouth at bedtime.   12/28/2019 at Unknown time  . VYVANSE 50 MG capsule Take 50 mg by mouth every morning.    12/28/2019 at Unknown time    Review of Systems See persistent in HPI. All other systems reviewed and non contributory Blood pressure 113/74, pulse 83, temperature 98.8 F (37.1 C), temperature source Oral, resp. rate 14, height 5\' 6"  (1.676 m), weight 66.5 kg, last menstrual period 11/23/2019, SpO2 99 %. Physical Exam GENERAL: Well-developed, well-nourished female in no acute distress.  LUNGS: Clear to auscultation bilaterally.  HEART: Regular rate and rhythm. ABDOMEN: Soft, nontender, nondistended. No organomegaly. PELVIC: Deferred to OR EXTREMITIES: No cyanosis, clubbing, or edema, 2+ distal pulses.  Results for orders placed or performed during the hospital encounter of 12/29/19 (from the past 24 hour(s))  ABO/Rh     Status: None   Collection Time: 12/29/19  5:00 AM  Result Value Ref Range   ABO/RH(D)      O POS Performed at [facility] [address], [place], [place] [address]   Pregnancy, urine POC     Status: None   Collection Time: 12/29/19 11:58 AM  Result Value Ref Range   Preg Test, Ur NEGATIVE NEGATIVE  Pregnancy, urine POC     Status: None   Collection Time: 12/29/19 12:01 PM  Result Value  Ref Range   Preg Test, Ur NEGATIVE NEGATIVE    No results found. US PELVIC COMPLETE WITH TRANSVAGINAL  Result Date: 10/27/2019 CLINICAL DATA:  Right ovarian dermoid cyst EXAM: TRANSABDOMINAL AND TRANSVAGINAL ULTRASOUND OF PELVIS TECHNIQUE: Both transabdominal and transvaginal ultrasound examinations of the pelvis were performed. Transabdominal technique was performed for global imaging of the pelvis including uterus, ovaries, adnexal regions, and pelvic cul-de-sac. It was necessary to proceed with endovaginal exam following the  transabdominal exam to visualize the uterus, endometrium, ovaries and adnexa. COMPARISON:  None FINDINGS: Uterus Measurements: 6.7 x 2.3 x 3.6 cm = volume: 30 mL. No fibroids or other mass visualized. Endometrium Thickness: 3 mm in thickness.  No focal abnormality visualized. Right ovary Measurements: Nearly entire right ovary appears replaced with echogenic mass measuring 7.5 x 6.8 x 4.1 cm compatible with dermoid. Left ovary Measurements: 3.2 x 2.6 x 2.4 cm = volume: 10.8 mL. Normal appearance/no adnexal mass. Other findings No free fluid. IMPRESSION: Echogenic mass within the right ovary measuring up to 7.5 cm compatible with dermoid cyst. This has enlarged since prior CT. Electronically Signed   By: Rolm Baptise M.D.   On: 10/27/2019 20:00   Assessment/Plan: 29 yo P0 with a right dermoid cyst here for surgical interventions - Discussed management with right cystectomy possible, oophorectomy. Risks, benefits and alternatives were explained including but not limited to risks of bleeding, infection and damage to adjacent organs. Patient verbalized understanding and all questions were answered  [name] 12/29/2019, 1:34 PM

## 2019-12-29 NOTE — Transfer of Care (Signed)
Immediate Anesthesia Transfer of Care Note  Patient: Mary Kane  Procedure(s) Performed: LAPAROSCOPIC OVARIAN CYSTECTOMY (Right )  Patient Location: PACU  Anesthesia Type:General  Level of Consciousness: awake, alert  and oriented  Airway & Oxygen Therapy: Patient Spontanous Breathing and Patient connected to nasal cannula oxygen  Post-op Assessment: Report given to RN  Post vital signs: Reviewed and stable  Last Vitals:  Vitals Value Taken Time  BP 123/74 12/29/19 1505  Temp    Pulse 93 12/29/19 1507  Resp 15 12/29/19 1507  SpO2 100 % 12/29/19 1507  Vitals shown include unvalidated device data.  Last Pain:  Vitals:   12/29/19 1221  TempSrc: Oral         Complications: No complications documented.

## 2019-12-29 NOTE — Progress Notes (Signed)
Dr. Elly Modena asked to call Patient's mother after Patient's mother stated that MD did not call her.  Dr. Elly Modena spoke with the patient in PACU, and Dr. Elly Modena will call to give an update to patient's mother. Patient also stated that she would update her mother. Offered to get patient's mother to hear discharge instructions, clarifying option for her mother to listen to instructions with her, or going over them with her alone and sending a copy of instructions home.   Patient stated that she wanted her mother to hear discharge instructions.  Patient then changed her mind stating, if she was going to get a copy with eveything in writing, she would be fine if I just go over them with her and send the copy home.

## 2019-12-29 NOTE — Discharge Instructions (Signed)
Diagnostic Laparoscopy, Care After This sheet gives you information about how to care for yourself after your procedure. Your health care provider may also give you more specific instructions. If you have problems or questions, contact your health care provider. What can I expect after the procedure? After the procedure, it is common to have:  Mild discomfort in the abdomen.  Sore throat. Women who have laparoscopy with pelvic examination may have mild cramping and fluid coming from the vagina for a few days after the procedure. Follow these instructions at home: Medicines  Take over-the-counter and prescription medicines only as told by your health care provider.  If you were prescribed an antibiotic medicine, take it as told by your health care provider. Do not stop taking the antibiotic even if you start to feel better. Driving  Do not drive for 24 hours if you were given a medicine to help you relax (sedative) during your procedure.  Do not drive or use heavy machinery while taking prescription pain medicine. Bathing  Do not take baths, swim, or use a hot tub until your health care provider approves. You may take showers. Incision care   Follow instructions from your health care provider about how to take care of your incisions. Make sure you: ? Wash your hands with soap and water before you change your bandage (dressing). If soap and water are not available, use hand sanitizer. ? Change your dressing as told by your health care provider. ? Leave stitches (sutures), skin glue, or adhesive strips in place. These skin closures may need to stay in place for 2 weeks or longer. If adhesive strip edges start to loosen and curl up, you may trim the loose edges. Do not remove adhesive strips completely unless your health care provider tells you to do that.  Check your incision areas every day for signs of infection. Check for: ? Redness, swelling, or pain. ? Fluid or  blood. ? Warmth. ? Pus or a bad smell. Activity  Return to your normal activities as told by your health care provider. Ask your health care provider what activities are safe for you.  Do not lift anything that is heavier than 10 lb (4.5 kg), or the limit that you are told, until your health care provider says that it is safe. General instructions  To prevent or treat constipation while you are taking prescription pain medicine, your health care provider may recommend that you: ? Drink enough fluid to keep your urine pale yellow. ? Take over-the-counter or prescription medicines. ? Eat foods that are high in fiber, such as fresh fruits and vegetables, whole grains, and beans. ? Limit foods that are high in fat and processed sugars, such as fried and sweet foods.  Do not use any products that contain nicotine or tobacco, such as cigarettes and e-cigarettes. If you need help quitting, ask your health care provider.  Keep all follow-up visits as told by your health care provider. This is important. Contact a health care provider if:  You develop shoulder pain.  You feel lightheaded or faint.  You are unable to pass gas or have a bowel movement.  You feel nauseous or you vomit.  You develop a rash.  You have redness, swelling, or pain around any incision.  You have fluid or blood coming from any incision.  Any incision feels warm to the touch.  You have pus or a bad smell coming from any incision.  You have a fever or chills. Get help   right away if:  You have severe pain.  You have vomiting that does not go away.  You have heavy bleeding from the vagina.  Any incision opens.  You have trouble breathing.  You have chest pain. Summary  After the procedure, it is common to have mild discomfort in the abdomen and a sore throat.  Check your incision areas every day for signs of infection.  Return to your normal activities as told by your health care provider. Ask  your health care provider what activities are safe for you. This information is not intended to replace advice given to you by your health care provider. Make sure you discuss any questions you have with your health care provider. Document Revised: 05/17/2017 Document Reviewed: 11/28/2016 Elsevier Patient Education  [place] Instructions  Activity: Get plenty of rest for the remainder of the day. A responsible individual must stay with you for 24 hours following the procedure.  For the next 24 hours, DO NOT: -Drive a car -Paediatric nurse -Drink alcoholic beverages -Take any medication unless instructed by your physician -Make any legal decisions or sign important papers.  Meals: Start with liquid foods such as gelatin or soup. Progress to regular foods as tolerated. Avoid greasy, spicy, heavy foods. If nausea and/or vomiting occur, drink only clear liquids until the nausea and/or vomiting subsides. Call your physician if vomiting continues.  Special Instructions/Symptoms: Your throat may feel dry or sore from the anesthesia or the breathing tube placed in your throat during surgery. If this causes discomfort, gargle with warm salt water. The discomfort should disappear within 24 hours.

## 2019-12-29 NOTE — Anesthesia Postprocedure Evaluation (Signed)
Anesthesia Post Note  Patient: Mary Kane  Procedure(s) Performed: Laparoscopic right oophorectomy (Right )     Patient location during evaluation: PACU Anesthesia Type: General Level of consciousness: awake and alert Pain management: pain level controlled Vital Signs Assessment: post-procedure vital signs reviewed and stable Respiratory status: spontaneous breathing, nonlabored ventilation, respiratory function stable and patient connected to nasal cannula oxygen Cardiovascular status: blood pressure returned to baseline and stable Postop Assessment: no apparent nausea or vomiting Anesthetic complications: no   No complications documented.  Last Vitals:  Vitals:   12/29/19 1545 12/29/19 1600  BP: 123/78 128/73  Pulse: 94 93  Resp: 15 17  Temp:    SpO2: 94% 93%    Last Pain:  Vitals:   12/29/19 1539  TempSrc:   PainSc: 8                  Barnet Glasgow

## 2019-12-29 NOTE — Anesthesia Procedure Notes (Signed)
Procedure Name: Intubation Date/Time: 12/29/2019 1:58 PM Performed by: Bonney Aid, CRNA Pre-anesthesia Checklist: Patient identified, Emergency Drugs available, Suction available and Patient being monitored Patient Re-evaluated:Patient Re-evaluated prior to induction Oxygen Delivery Method: Circle system utilized Preoxygenation: Pre-oxygenation with 100% oxygen Induction Type: IV induction Ventilation: Mask ventilation without difficulty Laryngoscope Size: Mac and 3 Grade View: Grade I Tube type: Oral Tube size: 7.0 mm Number of attempts: 1 Airway Equipment and Method: Stylet and Oral airway Placement Confirmation: ETT inserted through vocal cords under direct vision,  positive ETCO2 and breath sounds checked- equal and bilateral Secured at: 21 cm Tube secured with: Tape Dental Injury: Teeth and Oropharynx as per pre-operative assessment

## 2019-12-29 NOTE — Op Note (Signed)
Mary Kane PROCEDURE DATE: 12/29/2019  PREOPERATIVE DIAGNOSES: right dermoid cyst POSTOPERATIVE DIAGNOSES: The same PROCEDURE: Laparoscopic right oophorectomy SURGEON:  Dr. Mora Bellman ASSISTANT: Dr. Feliz Beam   INDICATIONS: 29 y.o. G0P0000 with aforementioned preoperative diagnoses here today for definitive surgical management.   Risks of surgery were discussed with the patient including but not limited to: bleeding which may require transfusion or reoperation; infection which may require antibiotics; injury to bowel, bladder, ureters or other surrounding organs; need for additional procedures including laparotomy; thromboembolic phenomenon, incisional problems and other postoperative/anesthesia complications. Written informed consent was obtained.    FINDINGS:  Small uterus, enlarged right ovary with normal right fallopian tube. Normal left adnexa.  No evidence of endometriosis, adhesions or any other abdominal/pelvic abnormality.  Normal upper abdomen.  ANESTHESIA:    General INTRAVENOUS FLUIDS: 800 ml ESTIMATED BLOOD LOSS: 10 ml SPECIMENS:  right ovary with sebaceous material and hair COMPLICATIONS: None immediate   PROCEDURE IN DETAIL:  The patient was taken to the operating room where general anesthesia was administered and was found to be adequate.  She was placed in the dorsal lithotomy position, and was prepped and draped in a sterile manner.  A Foley catheter was inserted into her bladder and attached to Adisson Deak drainage and a uterine manipulator was then advanced into the uterus .  After an adequate timeout was performed, attention was then turned to the patient's abdomen where a 11-mm skin incision was made in the umbilical fold.  The fascia was identified, grasped with Kocher clamps, incised and tagged with 0-Vicryl.  The 11-mm trocar and sleeve were then advanced without difficulty into the abdomen without difficulty and intraabdominal placement was confirmed by the  laparoscope. A survey of the patient's pelvis and abdomen revealed the findings above.   Bilateral 5-mm lower quadrant ports  were then placed under direct visualization.   On the right side, the uteroovarian ligament was then clamped and transected with the Harmonic device.  The right infundibulopelvic ligament was also clamped and transected allowing for oophorectomy.  Excellent hemostasis was noted. The specimen was then removed from the abdomen through the 11-mm port using an Endocatch bag, under direct visualization.  The operative site was surveyed, and it was found to be hemostatic.  No intraoperative injury to other surrounding organs was noted.  The abdomen was desufflated and all instruments were then removed from the patient's abdomen. The fascial incision of the umbilicus was closed with a 0 Vicryl figure of eight stitch.  All skin incisions were closed with 3-0 Vicryl subcuticular stitches/Dermabond.   The patient will be discharged to home as per PACU criteria.  Routine postoperative instructions given.  She was prescribed Percocet, Ibuprofen and Colace.  She will follow up in the clinic in 2 weeks for postoperative evaluation.

## 2019-12-30 ENCOUNTER — Encounter (HOSPITAL_BASED_OUTPATIENT_CLINIC_OR_DEPARTMENT_OTHER): Payer: Self-pay | Admitting: Obstetrics and Gynecology

## 2019-12-30 LAB — SURGICAL PATHOLOGY

## 2020-01-11 ENCOUNTER — Encounter: Payer: Self-pay | Admitting: Obstetrics and Gynecology

## 2020-01-11 ENCOUNTER — Other Ambulatory Visit: Payer: Self-pay

## 2020-01-11 ENCOUNTER — Ambulatory Visit (INDEPENDENT_AMBULATORY_CARE_PROVIDER_SITE_OTHER): Payer: BLUE CROSS/BLUE SHIELD | Admitting: Obstetrics and Gynecology

## 2020-01-11 VITALS — BP 119/89 | HR 128 | Wt 143.8 lb

## 2020-01-11 DIAGNOSIS — Z48816 Encounter for surgical aftercare following surgery on the genitourinary system: Secondary | ICD-10-CM

## 2020-01-11 DIAGNOSIS — Z9889 Other specified postprocedural states: Secondary | ICD-10-CM

## 2020-01-11 NOTE — Progress Notes (Signed)
29 yo P0 here for post op check. Patient underwent a laparoscopic oophorectomy for the treatment of a dermoid cyst. Patient reports doing well and is without complaints. She denies fever, pain or abnormal drainage from her incision  Past Medical History:  Diagnosis Date  . ADD (attention deficit disorder)   . Anxiety   . Depression   . Dermoid cyst   . History of iron deficiency anemia   . Insomnia   . Mental disorder   . Wears contact lenses   . Wears glasses    Past Surgical History:  Procedure Laterality Date  . LAPAROSCOPIC OVARIAN CYSTECTOMY Right 12/29/2019   Procedure: Laparoscopic right oophorectomy;  Surgeon: Mora Bellman, MD;  Location: [facility];  Service: Gynecology;  Laterality: Right;  . RHINOPLASTY    . WISDOM TOOTH EXTRACTION     Family History  Problem Relation Age of Onset  . Chromosomal disorder Father   . COPD Father    Social History   Tobacco Use  . Smoking status: Current Every Day Smoker    Packs/day: 0.50    Years: 2.00    Pack years: 1.00    Types: Cigarettes  . Smokeless tobacco: Never Used  Vaping Use  . Vaping Use: Never used  Substance Use Topics  . Alcohol use: Yes    Comment: socially  . Drug use: Yes    Types: Marijuana    Comment: nightly   ROS See pertinent in HPI  Blood pressure (!) 119/89, pulse (!) 128, weight 143 lb 12.8 oz (65.2 kg). GENERAL: Well-developed, well-nourished female in no acute distress.  ABDOMEN: Soft, nontender, nondistended. No organomegaly. Incisions: healed well x 3 without erythema, induration or drainage EXTREMITIES: No cyanosis, clubbing, or edema, 2+ distal pulses.  A/P 29 yo P0 s/p LSC oophorectomy due to dermoid cyst  - Patient is medically cleared to resume all activities of daily living - Reviewed pathology results - normal pap smear 10/2019 - RTC prn

## 2020-02-06 ENCOUNTER — Encounter (HOSPITAL_COMMUNITY): Payer: Self-pay

## 2020-02-06 ENCOUNTER — Other Ambulatory Visit: Payer: Self-pay

## 2020-02-06 DIAGNOSIS — X58XXXA Exposure to other specified factors, initial encounter: Secondary | ICD-10-CM | POA: Diagnosis not present

## 2020-02-06 DIAGNOSIS — Z79899 Other long term (current) drug therapy: Secondary | ICD-10-CM | POA: Diagnosis not present

## 2020-02-06 DIAGNOSIS — F17210 Nicotine dependence, cigarettes, uncomplicated: Secondary | ICD-10-CM | POA: Diagnosis not present

## 2020-02-06 DIAGNOSIS — S7012XA Contusion of left thigh, initial encounter: Secondary | ICD-10-CM | POA: Insufficient documentation

## 2020-02-06 DIAGNOSIS — Y999 Unspecified external cause status: Secondary | ICD-10-CM | POA: Diagnosis not present

## 2020-02-06 DIAGNOSIS — L089 Local infection of the skin and subcutaneous tissue, unspecified: Secondary | ICD-10-CM | POA: Insufficient documentation

## 2020-02-06 DIAGNOSIS — Y929 Unspecified place or not applicable: Secondary | ICD-10-CM | POA: Diagnosis not present

## 2020-02-06 DIAGNOSIS — F909 Attention-deficit hyperactivity disorder, unspecified type: Secondary | ICD-10-CM | POA: Diagnosis not present

## 2020-02-06 DIAGNOSIS — S79922A Unspecified injury of left thigh, initial encounter: Secondary | ICD-10-CM | POA: Diagnosis present

## 2020-02-06 DIAGNOSIS — Y939 Activity, unspecified: Secondary | ICD-10-CM | POA: Insufficient documentation

## 2020-02-06 LAB — CBC WITH DIFFERENTIAL/PLATELET
Abs Immature Granulocytes: 0.01 10*3/uL (ref 0.00–0.07)
Basophils Absolute: 0 10*3/uL (ref 0.0–0.1)
Basophils Relative: 0 %
Eosinophils Absolute: 0.4 10*3/uL (ref 0.0–0.5)
Eosinophils Relative: 4 %
HCT: 41.5 % (ref 36.0–46.0)
Hemoglobin: 13.3 g/dL (ref 12.0–15.0)
Immature Granulocytes: 0 %
Lymphocytes Relative: 45 %
Lymphs Abs: 4.4 10*3/uL — ABNORMAL HIGH (ref 0.7–4.0)
MCH: 28.2 pg (ref 26.0–34.0)
MCHC: 32 g/dL (ref 30.0–36.0)
MCV: 87.9 fL (ref 80.0–100.0)
Monocytes Absolute: 0.8 10*3/uL (ref 0.1–1.0)
Monocytes Relative: 8 %
Neutro Abs: 4.3 10*3/uL (ref 1.7–7.7)
Neutrophils Relative %: 43 %
Platelets: 302 10*3/uL (ref 150–400)
RBC: 4.72 MIL/uL (ref 3.87–5.11)
RDW: 13.2 % (ref 11.5–15.5)
WBC: 9.8 10*3/uL (ref 4.0–10.5)
nRBC: 0 % (ref 0.0–0.2)

## 2020-02-06 LAB — I-STAT BETA HCG BLOOD, ED (MC, WL, AP ONLY): I-stat hCG, quantitative: <5 m[IU]/mL (ref <5)

## 2020-02-06 NOTE — ED Triage Notes (Signed)
Arrived POV from home. Patient reports she had Ovarian cyst surgery in July and now there is pus coming from navel. Patient also reports unexplained bruising on back of left thigh that is only painful upon palpation.

## 2020-02-07 ENCOUNTER — Emergency Department (HOSPITAL_COMMUNITY)
Admission: EM | Admit: 2020-02-07 | Discharge: 2020-02-07 | Disposition: A | Discharge status: Home / Self Care | Payer: 59 | Attending: Emergency Medicine | Admitting: Emergency Medicine

## 2020-02-07 DIAGNOSIS — S7012XA Contusion of left thigh, initial encounter: Secondary | ICD-10-CM

## 2020-02-07 DIAGNOSIS — L089 Local infection of the skin and subcutaneous tissue, unspecified: Secondary | ICD-10-CM

## 2020-02-07 MED ORDER — CEPHALEXIN 500 MG PO CAPS
500.0000 mg | ORAL_CAPSULE | Freq: Four times a day (QID) | ORAL | 0 refills | Status: DC
Start: 2020-02-07 — End: 2020-03-26

## 2020-02-07 MED ORDER — CEPHALEXIN 500 MG PO CAPS
500.0000 mg | ORAL_CAPSULE | Freq: Once | ORAL | Status: AC
  Administered 2020-02-07: 500 mg via ORAL
  Filled 2020-02-07: qty 1

## 2020-02-07 NOTE — ED Provider Notes (Signed)
[facility] DEPT Provider Note   CSN: [id number] Arrival date & time: 02/06/20  2009     History Chief Complaint  Patient presents with  . Post-op Problem    Mary Kane is a 29 y.o. female.  Patient is a 29 year old female with past medical history of recent surgery for ovarian cyst, ADHD, anxiety.  She presents today for evaluation of drainage from her umbilical incision.  Patient had laparoscopic ovarian cyst surgery in July.  Today she noticed an area of drainage coming from her umbilicus.  She also feels as though it appears more red.  She denies any fevers or chills.  Patient also describes a bruise to the inside of her left thigh.  She denies having injured herself.  She just noticed an area with purple-colored bruising.  The history is provided by the patient.       Past Medical History:  Diagnosis Date  . ADD (attention deficit disorder)   . Anxiety   . Depression   . Dermoid cyst   . History of iron deficiency anemia   . Insomnia   . Mental disorder   . Wears contact lenses   . Wears glasses     Patient Active Problem List   Diagnosis Date Noted  . Dermoid cyst of ovary, right     Past Surgical History:  Procedure Laterality Date  . LAPAROSCOPIC OVARIAN CYSTECTOMY Right 12/29/2019   Procedure: Laparoscopic right oophorectomy;  Surgeon: Mora Bellman, MD;  Location: [facility];  Service: Gynecology;  Laterality: Right;  . RHINOPLASTY    . WISDOM TOOTH EXTRACTION       OB History    Gravida  0   Para  0   Term  0   Preterm  0   AB  0   Living  0     SAB  0   TAB  0   Ectopic  0   Multiple  0   Live Births  0           Family History  Problem Relation Age of Onset  . Chromosomal disorder Father   . COPD Father     Social History   Tobacco Use  . Smoking status: Current Every Day Smoker    Packs/day: 0.50    Years: 2.00    Pack years: 1.00    Types: Cigarettes  .  Smokeless tobacco: Never Used  Vaping Use  . Vaping Use: Never used  Substance Use Topics  . Alcohol use: Yes    Comment: socially  . Drug use: Yes    Types: Marijuana    Comment: nightly    Home Medications Prior to Admission medications   Medication Sig Start Date End Date Taking? Authorizing Provider  ALPRAZolam Duanne Moron) 0.5 MG tablet Take by mouth every evening.  05/18/15   [provider]  Cholecalciferol (VITAMIN D3) 125 MCG (5000 UT) CAPS Take by mouth every morning.    [provider]  docusate sodium (COLACE) 100 MG capsule Take 1 capsule (100 mg total) by mouth 2 (two) times daily as needed. Patient not taking: Reported on 01/11/2020 12/29/19   Constant, Peggy, MD  DULoxetine (CYMBALTA) 60 MG capsule Take 120 mg by mouth every morning.  09/15/19   [provider]  HYDROcodone-Acetaminophen 5-300 MG TABS Take 1 tablet by mouth every 4 (four) hours as needed. Patient not taking: Reported on 01/11/2020 12/29/19   Constant, Peggy, MD  ibuprofen (ADVIL) 600 MG  tablet Take 1 tablet (600 mg total) by mouth every 6 (six) hours as needed. Patient not taking: Reported on 01/11/2020 12/29/19   Constant, Peggy, MD  LO LOESTRIN FE 1 MG-10 MCG / 10 MCG tablet Take 1 tablet by mouth daily. 10/19/19   Constant, Peggy, MD  magnesium gluconate (MAGONATE) 500 MG tablet Take 500 mg by mouth every morning.    [provider]  QUEtiapine (SEROQUEL) 50 MG tablet Take 50 mg by mouth at bedtime. 11/05/19   [provider]  VYVANSE 50 MG capsule Take 50 mg by mouth every morning.  10/27/19   [provider]    Allergies    Patient has no known allergies.  Review of Systems   Review of Systems  All other systems reviewed and are negative.   Physical Exam Updated Vital Signs BP 122/77 (BP Location: Left Arm)   Pulse (!) 107   Temp 98.5 F (36.9 C) (Oral)   Resp 16   SpO2 100%   Physical Exam Vitals and nursing note reviewed.  Constitutional:       General: She is not in acute distress.    Appearance: Normal appearance. She is not ill-appearing.  HENT:     Head: Normocephalic and atraumatic.  Pulmonary:     Effort: Pulmonary effort is normal.  Abdominal:     General: Abdomen is flat. There is no distension.     Palpations: There is no mass.     Tenderness: There is no abdominal tenderness. There is no right CVA tenderness or left CVA tenderness.     Hernia: No hernia is present.     Comments: The umbilicus has a small area of erythema.  I am unable to express any purulent or serous drainage.  The abdomen is benign with no tenderness.  Musculoskeletal:     Comments: There is an 8 cm, round, ecchymotic area to the medial aspect of the left thigh.  Distal PMS is intact.  There is no swelling or other abnormality.  Skin:    General: Skin is warm and dry.  Neurological:     Mental Status: She is alert and oriented to person, place, and time.     ED Results / Procedures / Treatments   Labs (all labs ordered are listed, but only abnormal results are displayed) Labs Reviewed  CBC WITH DIFFERENTIAL/PLATELET - Abnormal; Notable for the following components:      Result Value   Lymphs Abs 4.4 (*)    All other components within normal limits  I-STAT BETA HCG BLOOD, ED ([facility], [facility], [facility] ONLY)    EKG None  Radiology No results found.  Procedures Procedures (including critical care time)  Medications Ordered in ED Medications  cephALEXin (KEFLEX) capsule 500 mg (has no administration in time range)    ED Course  I have reviewed the triage vital signs and the nursing notes.  Pertinent labs & imaging results that were available during my care of the patient were reviewed by me and considered in my medical decision making (see chart for details).    MDM Rules/Calculators/A&P  Patient describes serous drainage from her umbilical laparoscopic incision that I suspect is a ruptured seroma.  Patient will be treated with Keflex in  case there is a developing cellulitis.  Patient also has an ecchymotic area to the inside of her left thigh, the etiology of which I am uncertain.  She may have a small blood vessel that ruptured in this area as she denies  any recent trauma.  This will be treated with observation and as needed follow-up.  Final Clinical Impression(s) / ED Diagnoses Final diagnoses:  None    Rx / DC Orders ED Discharge Orders    None       Veryl Speak, MD 02/07/20 [phone]

## 2020-02-07 NOTE — Discharge Instructions (Addendum)
Begin taking Keflex as prescribed.  Local wound care with bacitracin and Band-Aid twice daily.  Follow-up with your primary doctor or return to the ER for increasing redness, swelling, fever, or other new and concerning symptoms.

## 2020-03-26 ENCOUNTER — Other Ambulatory Visit: Payer: Self-pay

## 2020-03-26 ENCOUNTER — Ambulatory Visit (HOSPITAL_COMMUNITY)
Admission: EM | Admit: 2020-03-26 | Discharge: 2020-03-26 | Disposition: A | Discharge status: Home / Self Care | Payer: 59

## 2020-03-26 DIAGNOSIS — F411 Generalized anxiety disorder: Secondary | ICD-10-CM | POA: Diagnosis not present

## 2020-03-26 DIAGNOSIS — F4323 Adjustment disorder with mixed anxiety and depressed mood: Secondary | ICD-10-CM

## 2020-03-26 DIAGNOSIS — F332 Major depressive disorder, recurrent severe without psychotic features: Secondary | ICD-10-CM

## 2020-03-26 NOTE — Discharge Instructions (Signed)

## 2020-03-26 NOTE — ED Provider Notes (Signed)
[facility]al [facility] Medical Screening Exam  Patient Name: Mary Kane MRN: [id number] Date of Evaluation: 03/26/20 Chief Complaint:   Diagnosis:  Final diagnoses:  Adjustment disorder with mixed anxiety and depressed mood    History of Present illness: Mary Kane is a 29 y.o. female.  Patient presents voluntarily to [facility] behavioral [facility] center for walk-in assessment.  Patient reports she is seeking outpatient psychiatry as she was dismissed from her primary care provider office on yesterday.  Patient reports she was followed by Dr. Derrek Monaco with [facility] who was her primary care provider.  Patient reports Dr. Derrek Monaco also treated her anxiety, current medications include duloxetine, Xanax, Seroquel and Vyvanse.  Patient reports "yesterday I told my doctor I could not pass a urine sample."  Patient reports she was then dismissed from the practice.  Patient reports "at that time I told him that I want to be dead but I did not mean that, I said that because I was upset."  Patient reports concern that he would be unable to find alternative psychiatry provider, she feels she has limited options related to her current insurer, [employer].  Patient denies suicidal ideations today.  Patient denies any history of suicide attempts.  Patient denies self-harm behaviors.  Patient reports 1 previous inpatient psychiatric admission approximately 10 years ago.  Patient assessed by nurse practitioner.  Patient alert and oriented, answers appropriately.  Patient becomes tearful when discussing suicidal thoughts states "I feel like you are trying to trap me here."  Initially patient refuses to allow collection of collateral information from her mother however eventually does request that mother be present during assessment.  Patient has been referring with her mother and father.  Patient denies access to weapons.  Patient reports she is unemployed but [employer] se[employer] for  employment.  Patient reports she is currently his masters prepared but is unable to find employment.  Patient denies alcohol use.  Patient reports marijuana use, occasionally.  Patient denies substance use aside from marijuana.  Patient offered support and encouragement.  Spoke with patient's mother, Mary Kane who denies concerns for patient safety. Patient's mother reports plan to assist patient in securing follow up appointments.   Psychiatric Specialty Exam  Presentation  General Appearance:Appropriate for Environment;Casual  Eye Contact:Good  Speech:Clear and Coherent;Normal Rate  Speech Volume:Normal  Handedness:Right   Mood and Affect  Mood:Anxious  Affect:Tearful   Thought Process  Thought Processes:Coherent;Goal Directed  Descriptions of Associations:Intact  Orientation:Full (Time, Place and Person)  Thought Content:Logical  Hallucinations:None  Ideas of Reference:None  Suicidal Thoughts:Yes, Passive Without Intent;Without Plan  Homicidal Thoughts:No   Sensorium  Memory:Immediate Good;Recent Good;Remote Good  Judgment:Fair  Insight:Fair   Executive Functions  Concentration:Fair  Attention Span:Fair  [place]   Psychomotor Activity  Psychomotor Activity:Normal   Assets  Assets:Communication Skills;Desire for Improvement;Financial Resources/Insurance;Housing;Physical Health;Social Support   Sleep  Sleep:Good  Number of hours: 9   Physical Exam: Physical Exam Vitals and nursing note reviewed.  Constitutional:      Appearance: She is well-developed.  HENT:     Head: Normocephalic.  Cardiovascular:     Rate and Rhythm: Normal rate.  Pulmonary:     Effort: Pulmonary effort is normal.  Neurological:     Mental Status: She is alert and oriented to person, place, and time.  Psychiatric:        Attention and Perception: Attention and perception normal.        Mood and Affect:  Affect normal. Mood is anxious.        Speech: Speech normal.        Behavior: Behavior normal. Behavior is cooperative.        Thought Content: Thought content includes suicidal ideation.        Cognition and Memory: Cognition and memory normal.        Judgment: Judgment normal.    Review of Systems  Constitutional: Negative.   HENT: Negative.   Eyes: Negative.   Respiratory: Negative.   Cardiovascular: Negative.   Gastrointestinal: Negative.   Genitourinary: Negative.   Musculoskeletal: Negative.   Skin: Negative.   Neurological: Negative.   Endo/Heme/Allergies: Negative.   Psychiatric/Behavioral: Positive for suicidal ideas. The patient is nervous/anxious.    Blood pressure (!) 118/92, pulse (!) 104, temperature 97.7 F (36.5 C), temperature source Tympanic, height 5\' 6"  (1.676 m), weight 145 lb (65.8 kg), SpO2 99 %. Body mass index is 23.4 kg/m.  Musculoskeletal: Strength & Muscle Tone: within normal limits Gait & Station: normal Patient leans: N/A   [place] [facility] Discharge Disposition for Follow up and Recommendations: Based on my evaluation the patient does not appear to have an emergency medical condition and can be discharged with resources and follow up care in outpatient services for Medication Management and Individual Therapy  Patient agrees with plan to follow-up with outpatient psychiatry for medication management and individual counseling.  Patient reviewed with Dr. Hampton Abbot.    Emmaline Kluver, FNP 03/26/2020, 5:14 PM

## 2020-03-26 NOTE — ED Notes (Signed)
Patient discharged home. AVS reviewed with patient and written copy given to patient and she verbalized understanding. Patient escorted off unit to meet family.

## 2020-03-26 NOTE — ED Notes (Signed)
belongings were left with mom in the lobby
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
## 2020-03-31 ENCOUNTER — Other Ambulatory Visit: Payer: Self-pay

## 2020-03-31 ENCOUNTER — Ambulatory Visit (INDEPENDENT_AMBULATORY_CARE_PROVIDER_SITE_OTHER): Payer: 59 | Admitting: Behavioral Health

## 2020-03-31 DIAGNOSIS — F4323 Adjustment disorder with mixed anxiety and depressed mood: Secondary | ICD-10-CM | POA: Diagnosis not present
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
## 2020-04-04 ENCOUNTER — Ambulatory Visit (INDEPENDENT_AMBULATORY_CARE_PROVIDER_SITE_OTHER): Payer: 59 | Admitting: Psychiatry

## 2020-04-04 ENCOUNTER — Encounter (HOSPITAL_COMMUNITY): Payer: Self-pay | Admitting: Psychiatry

## 2020-04-04 ENCOUNTER — Other Ambulatory Visit: Payer: Self-pay

## 2020-04-04 VITALS — BP 125/88 | HR 90 | Ht 66.0 in | Wt 149.0 lb

## 2020-04-04 DIAGNOSIS — F320 Major depressive disorder, single episode, mild: Secondary | ICD-10-CM

## 2020-04-04 DIAGNOSIS — F900 Attention-deficit hyperactivity disorder, predominantly inattentive type: Secondary | ICD-10-CM | POA: Diagnosis not present

## 2020-04-04 DIAGNOSIS — F17200 Nicotine dependence, unspecified, uncomplicated: Secondary | ICD-10-CM

## 2020-04-04 DIAGNOSIS — F32A Depression, unspecified: Secondary | ICD-10-CM | POA: Insufficient documentation

## 2020-04-04 DIAGNOSIS — F411 Generalized anxiety disorder: Secondary | ICD-10-CM

## 2020-04-04 MED ORDER — QUETIAPINE FUMARATE 50 MG PO TABS: 50.0000 mg | ORAL_TABLET | Freq: Every day | ORAL | 2 refills | Status: DC

## 2020-04-04 MED ORDER — ALPRAZOLAM 0.5 MG PO TABS: 0.5000 mg | ORAL_TABLET | Freq: Two times a day (BID) | ORAL | 0 refills | Status: DC | PRN

## 2020-04-04 MED ORDER — VYVANSE 50 MG PO CAPS: 50.0000 mg | ORAL_CAPSULE | ORAL | 0 refills | Status: DC

## 2020-04-04 MED ORDER — DULOXETINE HCL 60 MG PO CPEP: 120.0000 mg | ORAL_CAPSULE | ORAL | 2 refills | Status: DC

## 2020-04-04 MED ORDER — NICOTINE 7 MG/24HR TD PT24: 7.0000 mg | MEDICATED_PATCH | Freq: Every day | TRANSDERMAL | 2 refills | Status: DC
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
## 2020-04-18 ENCOUNTER — Other Ambulatory Visit: Payer: Self-pay | Admitting: Obstetrics and Gynecology

## 2020-04-18 MED ORDER — NORETHIN ACE-ETH ESTRAD-FE 1-20 MG-MCG PO TABS: 1.0000 | ORAL_TABLET | Freq: Every day | ORAL | 4 refills | Status: DC

## 2020-05-05 ENCOUNTER — Other Ambulatory Visit (HOSPITAL_COMMUNITY): Payer: Self-pay | Admitting: Psychiatry

## 2020-05-05 DIAGNOSIS — F411 Generalized anxiety disorder: Secondary | ICD-10-CM

## 2020-05-16 ENCOUNTER — Telehealth (HOSPITAL_COMMUNITY): Payer: Self-pay | Admitting: *Deleted

## 2020-05-16 NOTE — Telephone Encounter (Signed)
Patient called [facility] requesting refill on VYVANSE: Take 1 capsule (50 mg total) by mouth every morning.

## 2020-05-17 ENCOUNTER — Other Ambulatory Visit (HOSPITAL_COMMUNITY): Payer: Self-pay | Admitting: Psychiatry

## 2020-05-17 DIAGNOSIS — F900 Attention-deficit hyperactivity disorder, predominantly inattentive type: Secondary | ICD-10-CM

## 2020-05-17 MED ORDER — VYVANSE 50 MG PO CAPS: 50.0000 mg | ORAL_CAPSULE | ORAL | 0 refills | Status: DC

## 2020-05-17 NOTE — Telephone Encounter (Signed)
Medication refilled and sent to preferred pharmacy

## 2020-06-03 ENCOUNTER — Other Ambulatory Visit (HOSPITAL_COMMUNITY): Payer: Self-pay | Admitting: Psychiatry

## 2020-06-03 DIAGNOSIS — F411 Generalized anxiety disorder: Secondary | ICD-10-CM

## 2020-06-14 ENCOUNTER — Other Ambulatory Visit (HOSPITAL_COMMUNITY): Payer: Self-pay | Admitting: Psychiatry

## 2020-06-14 ENCOUNTER — Telehealth (HOSPITAL_COMMUNITY): Payer: Self-pay | Admitting: *Deleted

## 2020-06-14 DIAGNOSIS — F900 Attention-deficit hyperactivity disorder, predominantly inattentive type: Secondary | ICD-10-CM

## 2020-06-14 MED ORDER — VYVANSE 50 MG PO CAPS: 50.0000 mg | ORAL_CAPSULE | ORAL | 0 refills | Status: DC

## 2020-06-14 NOTE — Telephone Encounter (Signed)
Call from patient requesting a Rx for her Vyvanse. She should be out in the next two days. Will make this request of her provider.

## 2020-06-14 NOTE — Telephone Encounter (Signed)
Medication refilled and sent to preferred pharmacy

## 2020-06-19 ENCOUNTER — Other Ambulatory Visit (HOSPITAL_COMMUNITY): Payer: Self-pay | Admitting: Psychiatry

## 2020-06-19 DIAGNOSIS — F320 Major depressive disorder, single episode, mild: Secondary | ICD-10-CM

## 2020-06-19 DIAGNOSIS — F32A Depression, unspecified: Secondary | ICD-10-CM

## 2020-06-21 ENCOUNTER — Encounter (HOSPITAL_COMMUNITY): Payer: Self-pay | Admitting: Psychiatry

## 2020-06-21 ENCOUNTER — Other Ambulatory Visit: Payer: Self-pay

## 2020-06-21 ENCOUNTER — Telehealth (INDEPENDENT_AMBULATORY_CARE_PROVIDER_SITE_OTHER): Payer: 59 | Admitting: Psychiatry

## 2020-06-21 DIAGNOSIS — F17200 Nicotine dependence, unspecified, uncomplicated: Secondary | ICD-10-CM | POA: Diagnosis not present

## 2020-06-21 DIAGNOSIS — F411 Generalized anxiety disorder: Secondary | ICD-10-CM | POA: Diagnosis not present

## 2020-06-21 DIAGNOSIS — F32A Depression, unspecified: Secondary | ICD-10-CM

## 2020-06-21 DIAGNOSIS — F603 Borderline personality disorder: Secondary | ICD-10-CM | POA: Diagnosis not present

## 2020-06-21 DIAGNOSIS — F320 Major depressive disorder, single episode, mild: Secondary | ICD-10-CM | POA: Diagnosis not present

## 2020-06-21 DIAGNOSIS — F900 Attention-deficit hyperactivity disorder, predominantly inattentive type: Secondary | ICD-10-CM

## 2020-06-21 MED ORDER — QUETIAPINE FUMARATE 100 MG PO TABS: ORAL_TABLET | ORAL | 2 refills | Status: DC

## 2020-06-21 MED ORDER — VYVANSE 50 MG PO CAPS: 50.0000 mg | ORAL_CAPSULE | ORAL | 0 refills | Status: DC

## 2020-06-21 MED ORDER — ALPRAZOLAM 0.5 MG PO TABS: ORAL_TABLET | ORAL | 0 refills | Status: DC

## 2020-06-21 MED ORDER — DULOXETINE HCL 60 MG PO CPEP: 120.0000 mg | ORAL_CAPSULE | ORAL | 2 refills | Status: DC
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
## 2020-07-05 ENCOUNTER — Encounter (HOSPITAL_COMMUNITY): Payer: 59 | Admitting: Psychiatry

## 2020-07-12 ENCOUNTER — Other Ambulatory Visit: Payer: Self-pay

## 2020-07-12 ENCOUNTER — Ambulatory Visit (INDEPENDENT_AMBULATORY_CARE_PROVIDER_SITE_OTHER): Payer: 59 | Admitting: Licensed Clinical Social Worker

## 2020-07-12 DIAGNOSIS — F603 Borderline personality disorder: Secondary | ICD-10-CM | POA: Diagnosis not present
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
## 2020-07-26 ENCOUNTER — Ambulatory Visit (HOSPITAL_COMMUNITY): Payer: 59 | Admitting: Professional

## 2020-08-01 ENCOUNTER — Other Ambulatory Visit: Payer: Self-pay

## 2020-08-01 ENCOUNTER — Ambulatory Visit (INDEPENDENT_AMBULATORY_CARE_PROVIDER_SITE_OTHER): Payer: 59 | Admitting: Professional

## 2020-08-01 ENCOUNTER — Ambulatory Visit (HOSPITAL_COMMUNITY): Payer: 59 | Admitting: Professional

## 2020-08-01 ENCOUNTER — Telehealth (HOSPITAL_COMMUNITY): Payer: Self-pay | Admitting: Professional

## 2020-08-01 DIAGNOSIS — F603 Borderline personality disorder: Secondary | ICD-10-CM | POA: Diagnosis not present
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
## 2020-08-01 NOTE — Telephone Encounter (Signed)
See call log 

## 2020-08-03 ENCOUNTER — Telehealth (HOSPITAL_COMMUNITY): Payer: Self-pay | Admitting: *Deleted

## 2020-08-03 ENCOUNTER — Other Ambulatory Visit (HOSPITAL_COMMUNITY): Payer: Self-pay | Admitting: Psychiatry

## 2020-08-03 DIAGNOSIS — F411 Generalized anxiety disorder: Secondary | ICD-10-CM

## 2020-08-03 DIAGNOSIS — F900 Attention-deficit hyperactivity disorder, predominantly inattentive type: Secondary | ICD-10-CM

## 2020-08-03 MED ORDER — VYVANSE 50 MG PO CAPS: 50.0000 mg | ORAL_CAPSULE | ORAL | 0 refills | Status: DC

## 2020-08-03 NOTE — Telephone Encounter (Signed)
Call from patient requesting new rx for her Vyvanse and her Xanax. Will make this request of Dr Ronne Binning, patient should be out of these meds.

## 2020-08-03 NOTE — Telephone Encounter (Signed)
Medications filled and sent to preferred pharmacy.

## 2020-08-23 ENCOUNTER — Ambulatory Visit (HOSPITAL_COMMUNITY): Payer: 59 | Admitting: Professional

## 2020-08-30 ENCOUNTER — Ambulatory Visit (HOSPITAL_COMMUNITY): Payer: 59 | Admitting: Professional

## 2020-08-30 ENCOUNTER — Other Ambulatory Visit: Payer: Self-pay

## 2020-08-30 ENCOUNTER — Telehealth (HOSPITAL_COMMUNITY): Payer: Self-pay | Admitting: Professional

## 2020-08-30 NOTE — Telephone Encounter (Signed)
See call log 

## 2020-09-01 ENCOUNTER — Other Ambulatory Visit (HOSPITAL_COMMUNITY): Payer: Self-pay | Admitting: Psychiatry

## 2020-09-01 DIAGNOSIS — F411 Generalized anxiety disorder: Secondary | ICD-10-CM

## 2020-09-05 ENCOUNTER — Telehealth (HOSPITAL_COMMUNITY): Payer: Self-pay | Admitting: *Deleted

## 2020-09-05 NOTE — Telephone Encounter (Signed)
Call from patient needing a new rx of her Vyvanse. Reviewed the chart and she should be out. Will make Dr Ronne Binning aware of need for a new rx.

## 2020-09-06 ENCOUNTER — Other Ambulatory Visit (HOSPITAL_COMMUNITY): Payer: Self-pay | Admitting: Psychiatry

## 2020-09-06 DIAGNOSIS — F900 Attention-deficit hyperactivity disorder, predominantly inattentive type: Secondary | ICD-10-CM

## 2020-09-06 MED ORDER — VYVANSE 50 MG PO CAPS: 50.0000 mg | ORAL_CAPSULE | ORAL | 0 refills | Status: DC

## 2020-09-06 NOTE — Telephone Encounter (Signed)
Medications refilled and sent to preferred pharmacy.

## 2020-09-15 ENCOUNTER — Ambulatory Visit (INDEPENDENT_AMBULATORY_CARE_PROVIDER_SITE_OTHER): Payer: 59 | Admitting: Professional

## 2020-09-15 ENCOUNTER — Other Ambulatory Visit: Payer: Self-pay

## 2020-09-15 DIAGNOSIS — F603 Borderline personality disorder: Secondary | ICD-10-CM

## 2020-09-15 NOTE — Progress Notes (Signed)
Virtual Visit via Video Note  I connected with Mary Kane on 09/15/20 at 11:00 AM EDT by a video enabled telemedicine application and verified that I am speaking with the correct person using two identifiers.  Location: Patient: Home Provider: Clinical Home Office   I discussed the limitations of evaluation and management by telemedicine and the availability of in person appointments. The patient expressed understanding and agreed to proceed.  Follow Up Instructions:    I discussed the assessment and treatment plan with the patient. The patient was provided an opportunity to ask questions and all were answered. The patient agreed with the plan and demonstrated an understanding of the instructions.   The patient was advised to call back or seek an in-person evaluation if the symptoms worsen or if the condition fails to improve as anticipated.  I provided 59 minutes of non-face-to-face time during this encounter.   Royetta Crochet, [facility]    THERAPIST PROGRESS NOTE  Session Time: 11a  Participation Level: Active  Behavioral Response: CasualAlertDepressed  Type of Therapy: Individual Therapy  Treatment Goals addressed: BPD  Interventions: CBT, DBT, Solution Focused, Strength-based, Supportive and Reframing  Summary: Mary Kane is a 30 y.o. female who presents with BPD symptoms. Pt reports a lot of changes since last time spoke. Pt reports "the last month has been a bit different than normal." Pt traveled to [facility] for a work conference and to see friends. Pt started a part-time job; working from home; Sales executive for [employer]. Pt reports she is scheduled for about 8 hours a week and would like more hours. Pt reports some anxiety related to getting emails and not getting resentful towards the work as has in the past. Pt reports she has never held a job for a long time due to these feelings. Cln and pt spend time discussing reframing and thought stopping.  Pt reports she wants to move but doesn't have the income. Pt reports she is struggling with a hospital debt which is overshadowing everything. Cln and pt discuss how to break overall goal into "bite size pieces." "I just feel constantly set up for failure in this country." Cln and pt discuss control and radical acceptance. Pt reports "I have had some very BPD fights in the last week." Pt discusses arguments she had with Mom and friend. Cln and pt discuss further about thoughts and hanging on to negative vs. positive. Cln and pt discuss doing opposite actions. Cln and pt discuss using assertive communication to own behaviors. Pt denies SI/HI/AVH.   Suicidal/Homicidal: Nowithout intent/plan  Therapist Response: Cln asked how the client has been since last seen. Cln asked open ended questions about positive and/or negative changes that have occurred since last seen. Cln used CBT to discuss thoughts and thought stopping. Cln used CBT to discuss black/white thinking and "should" statements with pt. Cln used DBT to discuss radical acceptance. Cln assisted with scheduling next appointment.     Plan: Return again in 2 weeks.  Diagnosis: BPD    Royetta Crochet, [facility] 09/15/2020

## 2020-09-20 ENCOUNTER — Encounter (HOSPITAL_COMMUNITY): Payer: Self-pay | Admitting: Psychiatry

## 2020-09-20 ENCOUNTER — Telehealth (INDEPENDENT_AMBULATORY_CARE_PROVIDER_SITE_OTHER): Payer: 59 | Admitting: Psychiatry

## 2020-09-20 ENCOUNTER — Other Ambulatory Visit: Payer: Self-pay

## 2020-09-20 DIAGNOSIS — F411 Generalized anxiety disorder: Secondary | ICD-10-CM

## 2020-09-20 DIAGNOSIS — F320 Major depressive disorder, single episode, mild: Secondary | ICD-10-CM | POA: Diagnosis not present

## 2020-09-20 DIAGNOSIS — F32A Depression, unspecified: Secondary | ICD-10-CM

## 2020-09-20 DIAGNOSIS — F900 Attention-deficit hyperactivity disorder, predominantly inattentive type: Secondary | ICD-10-CM

## 2020-09-20 MED ORDER — VYVANSE 50 MG PO CAPS: 50.0000 mg | ORAL_CAPSULE | ORAL | 0 refills | Status: DC

## 2020-09-20 MED ORDER — ALPRAZOLAM 0.25 MG PO TABS: ORAL_TABLET | ORAL | 2 refills | Status: DC

## 2020-09-20 MED ORDER — DULOXETINE HCL 60 MG PO CPEP: 120.0000 mg | ORAL_CAPSULE | ORAL | 2 refills | Status: DC

## 2020-09-20 MED ORDER — QUETIAPINE FUMARATE 100 MG PO TABS: ORAL_TABLET | ORAL | 2 refills | Status: DC

## 2020-09-20 MED ORDER — HYDROXYZINE HCL 10 MG PO TABS: 10.0000 mg | ORAL_TABLET | Freq: Three times a day (TID) | ORAL | 2 refills | Status: DC | PRN

## 2020-09-20 NOTE — Progress Notes (Signed)
[facility] MD/PA/NP OP Progress Note Virtual Visit via Telephone Note  I connected with Mary Kane on 09/20/20 at 11:00 AM EDT by telephone and verified that I am speaking with the correct person using two identifiers.  Location: Patient: home Provider: Clinic   I discussed the limitations, risks, security and privacy concerns of performing an evaluation and management service by telephone and the availability of in person appointments. I also discussed with the patient that there may be a patient responsible charge related to this service. The patient expressed understanding and agreed to proceed.   I provided 30 minutes of non-face-to-face time during this encounter.      09/20/2020 11:34 AM Mary Kane  MRN:  [id number]  Chief Complaint: "I got into a fight with family and friend but I feel Im a little more stable"  HPI: 30 year old female seen today for follow up psychiatric evaluation. She has a psychiatric history of anxiety, depression, ADHD, borderline personality, and past SI.  She is currently managed on Xanax 0.5 mg twice daily as needed, duloxetine 120 mg daily, Seroquel 100 mg at bedtime, and Vyvanse 50 mg daily.  She notes that her medications are somewhat effective in managing her psychiatric conditions.  Today she was unable to login virtually so her assessment was done over the phone. During exam she was pleasant, cooperative, and engaged in conversation.  She informed Probation officer that since her last visit her mood has been more stable but notes that she got into several fights with her friends and family. She notes that she is learning positive coping mechanism in therapy to deal with interpersonal conflict. She notes that there was a lapse in her therapy session and she had some interpersonal issues.   Patient notes that since her last visit her anxiety and depression has somewhat improved. Today  provider conducted a GAD-7 today and patient scored a 12, at last visit patient  scored a 15.  Provider also conducted a PHQ-9 and patient scored 9, at last visit patient scored a 18.  She notes that her suicidal thoughts has diminished however notes at times she hates herself and wishes she were dead. Today denies SI/HI/VAH or paranoia.  Today patient agreeable to reduce Xanax 0.5 mg twice daily to 0.25 mg twice daily. Patient became tearful when discussing reduction of Xanax noting that she fears that she will be more anxious without it. Provider recommended starting hydroxyzine 10 mg three times daily to help manage anxiety. She was agreeable to this recommendation and notes she would research the medication. Potential side effects of medication and risks vs benefits of treatment vs non-treatment were explained and discussed. All questions were answered. She will follow up with outpatient counseling for therapy. She will continue all other medications as prescribes. No other concerns noted at this time.   Visit Diagnosis:    ICD-10-CM   1. Generalized anxiety disorder  F41.1 ALPRAZolam (XANAX) 0.25 MG tablet    hydrOXYzine (ATARAX/VISTARIL) 10 MG tablet    DULoxetine (CYMBALTA) 60 MG capsule  2. Mild depression (HCC)  F32.0 DULoxetine (CYMBALTA) 60 MG capsule    QUEtiapine (SEROQUEL) 100 MG tablet  3. Attention deficit hyperactivity disorder (ADHD), predominantly inattentive type  F90.0 VYVANSE 50 MG capsule    Past Psychiatric History:  anxiety, depression, ADHD, and past SI  Past Medical History:  Past Medical History:  Diagnosis Date  . ADD (attention deficit disorder)   . Anxiety   . Depression   . Dermoid cyst   .  History of iron deficiency anemia   . Insomnia   . Mental disorder   . Wears contact lenses   . Wears glasses     Past Surgical History:  Procedure Laterality Date  . LAPAROSCOPIC OVARIAN CYSTECTOMY Right 12/29/2019   Procedure: Laparoscopic right oophorectomy;  Surgeon: Mora Bellman, MD;  Location: [facility];  Service:  Gynecology;  Laterality: Right;  . RHINOPLASTY    . WISDOM TOOTH EXTRACTION      Family Psychiatric History: Materal grandmother has mental conditions however notes that she is unaware which. Also notes that cousins have mental heath issues however unaware of which ones.   Family History:  Family History  Problem Relation Age of Onset  . Chromosomal disorder Father   . COPD Father     Social History:  Social History   Socioeconomic History  . Marital status: Single    Spouse name: Not on file  . Number of children: Not on file  . Years of education: Not on file  . Highest education level: Not on file  Occupational History  . Not on file  Tobacco Use  . Smoking status: Current Every Day Smoker    Packs/day: 0.50    Years: 2.00    Pack years: 1.00    Types: Cigarettes  . Smokeless tobacco: Never Used  Vaping Use  . Vaping Use: Never used  Substance and Sexual Activity  . Alcohol use: Yes    Comment: socially  . Drug use: Yes    Types: Marijuana    Comment: nightly  . Sexual activity: Not Currently    Birth control/protection: Pill  Other Topics Concern  . Not on file  Social History Narrative  . Not on file   Social Determinants of Health   Financial Resource Strain: Low Risk   . Difficulty of Paying Living Expenses: Not very hard  Food Insecurity: No Food Insecurity  . Worried About Charity fundraiser in the Last Year: Never true  . Ran Out of Food in the Last Year: Never true  Transportation Needs: No Transportation Needs  . Lack of Transportation (Medical): No  . Lack of Transportation (Non-Medical): No  Physical Activity: Inactive  . Days of Exercise per Week: 0 days  . Minutes of Exercise per Session: 0 min  Stress: No Stress Concern Present  . Feeling of Stress : Only a little  Social Connections: Socially Isolated  . Frequency of Communication with Friends and Family: Twice a week  . Frequency of Social Gatherings with Friends and Family: Never   . Attends Religious Services: Never  . Active Member of Clubs or Organizations: No  . Attends Archivist Meetings: Never  . Marital Status: Never married    Allergies: No Known Allergies  Metabolic Disorder Labs: No results found for: HGBA1C, MPG No results found for: PROLACTIN No results found for: CHOL, TRIG, HDL, CHOLHDL, VLDL, LDLCALC No results found for: TSH  Therapeutic Level Labs: No results found for: LITHIUM No results found for: VALPROATE No components found for:  CBMZ  Current Medications: Current Outpatient Medications  Medication Sig Dispense Refill  . hydrOXYzine (ATARAX/VISTARIL) 10 MG tablet Take 1 tablet (10 mg total) by mouth 3 (three) times daily as needed. 90 tablet 2  . ALPRAZolam (XANAX) 0.25 MG tablet Take one tablet twice daily if needed. 60 tablet 2  . DULoxetine (CYMBALTA) 60 MG capsule Take 2 capsules (120 mg total) by mouth every morning. 60 capsule 2  .  LO LOESTRIN FE 1 MG-10 MCG / 10 MCG tablet Take 1 tablet by mouth daily. 3 Package 4  . norethindrone-ethinyl estradiol (JUNEL FE 1/20) 1-20 MG-MCG tablet Take 1 tablet by mouth daily. 84 tablet 4  . QUEtiapine (SEROQUEL) 100 MG tablet TAKE 1 TABLET BY MOUTH EVERYDAY AT BEDTIME 100 tablet 2  . VYVANSE 50 MG capsule Take 1 capsule (50 mg total) by mouth every morning. 30 capsule 0   No current facility-administered medications for this visit.     Musculoskeletal: Strength & Muscle Tone: Unable to assess due to telephone visit Gait & Station: Unable to assess due to telephone visit Patient leans: N/A  Psychiatric Specialty Exam: Review of Systems  There were no vitals taken for this visit.There is no height or weight on file to calculate BMI.  General Appearance: Unable to assess due to telephone visit  Eye Contact:  Unable to assess due to telephone visit  Speech:  Clear and Coherent and Normal Rate  Volume:  Normal  Mood:  Anxious and Depressed  Affect:  Appropriate  Thought  Process:  Coherent, Goal Directed and Linear  Orientation:  Full (Time, Place, and Person)  Thought Content: WDL and Logical   Suicidal Thoughts:  No  Homicidal Thoughts:  No  Memory:  Immediate;   Good Recent;   Good Remote;   Good  Judgement:  Good  Insight:  Good  Psychomotor Activity:  Normal  Concentration:  Concentration: Good and Attention Span: Good  Recall:  Good  Fund of Knowledge: Good  Language: Good  Akathisia:  No  Handed:  Right  AIMS (if indicated): Not done  Assets:  Communication Skills Desire for Improvement Financial Resources/Insurance Housing Social Support  ADL's:  Intact  Cognition: WNL  Sleep:  Good   Screenings: GAD-7   Flowsheet Row Video Visit from 09/20/2020 in [facility] Video Visit from 06/21/2020 in [place] [facility] from 04/04/2020 in [facility]  Total GAD-7 Score 12 15 17     PHQ2-9   Flowsheet Row Video Visit from 09/20/2020 in [facility] Counselor from 07/12/2020 in [facility] Video Visit from 06/21/2020 in [place] from 04/04/2020 in [place]  PHQ-2 Total Score 1 6 6 2   PHQ-9 Total Score 9 21 18 9     Flowsheet Row Video Visit from 09/20/2020 in [facility] Counselor from 07/12/2020 in [place] Error: Q7 should not be populated when Q6 is No Low Risk       Assessment and Plan: Patient notes that her anxiety, depression, and mood has improved since last visit. She notes that she is learning how to deal with interpersonal conflicts in therapy. Today patient agreeable to reduce Xanax 0.5 mg twice daily to 0.25 mg twice daily. Patient became tearful when discussing reduction of Xanax noting that she fears that she will be more anxious  without it. Provider recommended starting hydroxyzine 10 mg three times daily to help manage anxiety. She was agreeable to this recommendation and notes she would research the medication. She will continue all other medications as prescribes. 1. Generalized anxiety disorder  Reduced- ALPRAZolam (XANAX) 0.25 MG tablet; TAKE 1 TABLET BY MOUTH TWICE A DAY AS NEEDED FOR ANXIETY  Dispense: 60 tablet; Refill: 0 Continue- DULoxetine (CYMBALTA) 60 MG capsule; Take 2 capsules (120 mg total)  by mouth every morning.  Dispense: 60 capsule; Refill: 2  2. Mild depression ([place])  Start- Ambulatory referral to Social Work Continue- DULoxetine (CYMBALTA) 60 MG capsule; Take 2 capsules (120 mg total) by mouth every morning.  Dispense: 60 capsule; Refill: 2 Continue- QUEtiapine (SEROQUEL) 100 MG tablet; TAKE 1 TABLET BY MOUTH EVERYDAY AT BEDTIME  Dispense: 100 tablet; Refill: 2  3. Attention deficit hyperactivity disorder (ADHD), predominantly inattentive type rk Continue- VYVANSE 50 MG capsule; Take 1 capsule (50 mg total) by mouth every morning.  Dispense: 30 capsule; Refill: 0  4. Borderline personality disorder ([place])  Follow up in 3 months Follow up with therapy    Salley Slaughter, NP 09/20/2020, 11:34 AM

## 2020-09-29 ENCOUNTER — Other Ambulatory Visit: Payer: Self-pay

## 2020-09-29 ENCOUNTER — Ambulatory Visit (INDEPENDENT_AMBULATORY_CARE_PROVIDER_SITE_OTHER): Payer: 59 | Admitting: Professional

## 2020-09-29 DIAGNOSIS — F603 Borderline personality disorder: Secondary | ICD-10-CM | POA: Diagnosis not present

## 2020-09-29 NOTE — Progress Notes (Signed)
Virtual Visit via Video Note  I connected with Mary Kane on 09/29/20 at 10:00 AM EDT by a video enabled telemedicine application and verified that I am speaking with the correct person using two identifiers.  Location: Patient: Home Provider: Clinical Home Office   I discussed the limitations of evaluation and management by telemedicine and the availability of in person appointments. The patient expressed understanding and agreed to proceed.  Follow Up Instructions:    I discussed the assessment and treatment plan with the patient. The patient was provided an opportunity to ask questions and all were answered. The patient agreed with the plan and demonstrated an understanding of the instructions.   The patient was advised to call back or seek an in-person evaluation if the symptoms worsen or if the condition fails to improve as anticipated.  I provided 49 minutes of non-face-to-face time during this encounter.   Royetta Crochet, [facility]    THERAPIST PROGRESS NOTE  Session Time: 10a  Participation Level: Active  Behavioral Response: CasualAlertDepressed  Type of Therapy: Individual Therapy  Treatment Goals addressed: BPD  Interventions: CBT, DBT, Solution Focused, Strength-based, Supportive and Reframing  Summary: Mary Kane is a 30 y.o. female who presents with BPD symptoms. Pt reports "things have been interesting" since we last spoke. Pt states a friend's mom had a health scare and she helped take care of her over the weekend. Pt reports "I had a lot of anxiety related to that because I was out of my- I had extreme anxiety after I left due to thinking people hated me after I left. I have struggled to get my work week going. I messaged with my ex." Cln and pt spend time discussing goals of reaching out to ex. Pt reports "I want relief from feeling abandoned." Cln discusses the cycle of opening this wound again. Cln and pt spend time discussing reading the letter pt  wrote to self to help break the cycle. Pt reports she did not read the letter before reaching out to him. Pt reports she realizes she is bored and is looking for attention which is why she reached out. Cln and pt spend time discussing reframing thoughts related to situation and control. Cln and pt spend time discussing moving happiness over the horizon and how to find it in the moment. Cln challenges pt to write down pros/cons of moving to another area. Cln and pt spend time discussing dangers of comparisons and how to stop self. Pt denies SI/HI/AVH.  Suicidal/Homicidal: Nowithout intent/plan  Therapist Response: Cln asked how the client has been since last seen. Cln asked open ended questions about positive and/or negative changes that have occurred since last seen. Cln used CBT to discuss thoughts and thought stopping and reframing. Cln used DBT to discuss radical acceptance. Cln assisted with scheduling next appointment.  Pt will write pros/cons list about moving. Pt will stop self when using comparisons and remind self "it's OK to be me."   Plan: Return again in 2 weeks.  Diagnosis: BPD   Royetta Crochet, [facility] 09/29/2020

## 2020-10-13 ENCOUNTER — Other Ambulatory Visit (HOSPITAL_COMMUNITY): Payer: Self-pay | Admitting: Psychiatry

## 2020-10-13 DIAGNOSIS — F411 Generalized anxiety disorder: Secondary | ICD-10-CM

## 2020-10-18 ENCOUNTER — Other Ambulatory Visit: Payer: Self-pay

## 2020-10-18 ENCOUNTER — Ambulatory Visit (INDEPENDENT_AMBULATORY_CARE_PROVIDER_SITE_OTHER): Payer: 59 | Admitting: Professional

## 2020-10-18 DIAGNOSIS — F603 Borderline personality disorder: Secondary | ICD-10-CM | POA: Diagnosis not present

## 2020-10-18 NOTE — Progress Notes (Signed)
Virtual Visit via Telephone Note  I connected with Maley Venezia on 10/18/20 at 11:00 AM EDT by telephone and verified that I am speaking with the correct person using two identifiers.  Location: Patient: Home Provider: Clinical Home Office   I discussed the limitations, risks, security and privacy concerns of performing an evaluation and management service by telephone and the availability of in person appointments. I also discussed with the patient that there may be a patient responsible charge related to this service. The patient expressed understanding and agreed to proceed.  Follow Up Instructions:    I discussed the assessment and treatment plan with the patient. The patient was provided an opportunity to ask questions and all were answered. The patient agreed with the plan and demonstrated an understanding of the instructions.   The patient was advised to call back or seek an in-person evaluation if the symptoms worsen or if the condition fails to improve as anticipated.  I provided 48 minutes of non-face-to-face time during this encounter.   Royetta Crochet, [facility]     THERAPIST PROGRESS NOTE  Session Time: 11a  Participation Level: Active  Behavioral Response: NAAlertDepressed  Type of Therapy: Individual Therapy  Treatment Goals addressed: BPD  Interventions: CBT, DBT, Solution Focused, Strength-based, Supportive and Reframing  Summary: Emiley Digiacomo is a 30 y.o. female who presents with BPD symptoms. Pt reports "things have not been so good." Pt reports passive SI, without intent or plan. Cln and pt spend time discussing SI and how to manage; pt reports she told her family and she slept. "I don't want to hurt myself. I just didn't want to be here." Pt reports "I already want to go to sleep because I am overwhelmed." Cln and pt spend time discussing safety plan for if thoughts have intent/plan. Pt does not want to call a hotline or go to [facility] "because they weren't  helpful other times." Cln and pt spend time discussing the importance of getting help vs. not. Pt reports "my family isn't helpful because they get mad at me." Pt reports sleeping is the only thing that helps. Pt reports she would like to work on getting more skills to help her in the future. Pt again denies intent/plan. Pt reports "I am angry about this [employer] thing." (abortion). Cln and pt spend time discussing what control we have and what can be done. Cln and pt spend time discussing radical acceptance and should statements again. Pt reports "I am not enjoying anything in my life right now. I don't know what will bring me joy." Cln and pt spend time discussing how to find joy. Cln and pt spend time discussing distractions and how to use. Cln and pt spend time discussing breaking projects into smaller pieces that are more manageable. Pt is resistant to ideas stating "I don't know what will work anymore." Cln and pt spend time discussing options and choices, vs. have-tos and shoulds. Pt again denies current SI/HI/AVH.  Suicidal/Homicidal: Nowithout intent/plan  Therapist Response: Cln asked how the client has been since last seen. Cln asked open ended questions about positive and/or negative changes that have occurred since last seen. Cln used CBT to discuss thoughts and thought stopping and reframing. Cln used DBT to discuss radical acceptance. Cln assisted with scheduling next appointment.    Plan: Pt is scheduled to f/u with Paige Cozart on 5/19 @ 10a.  Diagnosis: BPD   Royetta Crochet, [facility] 10/18/2020

## 2020-10-20 ENCOUNTER — Other Ambulatory Visit (HOSPITAL_COMMUNITY): Payer: Self-pay | Admitting: Psychiatry

## 2020-10-20 DIAGNOSIS — F411 Generalized anxiety disorder: Secondary | ICD-10-CM

## 2020-11-03 ENCOUNTER — Ambulatory Visit (INDEPENDENT_AMBULATORY_CARE_PROVIDER_SITE_OTHER): Payer: 59 | Admitting: Clinical

## 2020-11-03 ENCOUNTER — Other Ambulatory Visit: Payer: Self-pay

## 2020-11-03 DIAGNOSIS — F603 Borderline personality disorder: Secondary | ICD-10-CM | POA: Diagnosis not present

## 2020-11-04 NOTE — Progress Notes (Signed)
   THERAPIST PROGRESS NOTE Virtual Visit via Video Note  I connected with Mary Kane on 11/03/2020 at 10:00 AM EDT by a video enabled telemedicine application and verified that I am speaking with the correct person using two identifiers.  Location: Patient: home Provider: office   I discussed the limitations of evaluation and management by telemedicine and the availability of in person appointments. The patient expressed understanding and agreed to proceed.   Follow Up Instructions: I discussed the assessment and treatment plan with the patient. The patient was provided an opportunity to ask questions and all were answered. The patient agreed with the plan and demonstrated an understanding of the instructions.   The patient was advised to call back or seek an in-person evaluation if the symptoms worsen or if the condition fails to improve as anticipated.   Session Time: 57 minutes  Participation Level: Active  Behavioral Response: CasualAlertDepressed  Type of Therapy: Individual Therapy  Treatment Goals addressed: Coping  Interventions: CBT  Summary:  Mary Kane is a 30 y.o. female who presents for the scheduled sessio oriented times five, appropriately dressed, and friendly. Client denied hallucinations and delusions. Client reported on today she has been feeling down. Client reported her thoughts have been corned about [place] and the state of the country. Client reported she has done extensive work with protesting and advocating. Client reported she has been struggling with her personal relationships. Client reported a good friend of hers does not discuss politics which makes the client uncomfortable because she does not feel supported by vocalizing her political views. Client reported she does not know if she could continue to be friends with someone who does not share the same views. Client reported having a hard time with having a relationship with her parents  because they call her "hysterical and obsessed" with politics. Client reported she and her parents do not share the same political views. Client reported feeling alienated due to having no one to share her thoughts with has caused her a deep depression at times with thoughts of "not wanting to be here". Client reported no history of or current intent to harm herself. Client reported being hard on herself because she has no one to talk to and from negative backlash about her stance on politics.   Suicidal/Homicidal: Nowithout intent/plan  Therapist Response:  Therapist began by making introductions and discussing the brief transfer of care while her therapist is gone. Therapist used CBT to ask the client how she has been doing since last seen. Therapist used CBT to use positive emotional support while client discussed her thoughts and feelings.  Therapist used CBT to discuss emotional boundaries. Therapist assigned the client homework to brainstorm how she can improve her emotional boundaries. Client was scheduled for next appointment.    Plan: Return again in 5 weeks.  Diagnosis: Borderline personality disorder  Birdena Jubilee Taft Worthing, LCSW 11/03/2020

## 2020-11-07 ENCOUNTER — Telehealth (HOSPITAL_COMMUNITY): Payer: Self-pay | Admitting: *Deleted

## 2020-11-07 ENCOUNTER — Other Ambulatory Visit (HOSPITAL_COMMUNITY): Payer: Self-pay | Admitting: Psychiatry

## 2020-11-07 DIAGNOSIS — F900 Attention-deficit hyperactivity disorder, predominantly inattentive type: Secondary | ICD-10-CM

## 2020-11-07 MED ORDER — VYVANSE 50 MG PO CAPS: 50.0000 mg | ORAL_CAPSULE | ORAL | 0 refills | Status: DC

## 2020-11-07 NOTE — Telephone Encounter (Signed)
Medication refilled and sent to preferred pharmacy

## 2020-11-07 NOTE — Telephone Encounter (Signed)
VM left on writers phone requesting a rx be called in for her Vyvanse. The message was left over the weekend. Reviewed the chart and she should have been out of the Vyvanse early in the month, around the 5th. Will ask Dr Ronne Binning to call it in to her preferred pharmacy which is [facility] on [address].

## 2020-12-06 ENCOUNTER — Telehealth (HOSPITAL_COMMUNITY): Payer: Self-pay | Admitting: *Deleted

## 2020-12-06 ENCOUNTER — Other Ambulatory Visit (HOSPITAL_COMMUNITY): Payer: Self-pay | Admitting: Psychiatry

## 2020-12-06 DIAGNOSIS — F900 Attention-deficit hyperactivity disorder, predominantly inattentive type: Secondary | ICD-10-CM

## 2020-12-06 MED ORDER — VYVANSE 50 MG PO CAPS: 50.0000 mg | ORAL_CAPSULE | ORAL | 0 refills | Status: DC

## 2020-12-06 NOTE — Telephone Encounter (Signed)
Medication refilled and sent to preferred pharmacy

## 2020-12-06 NOTE — Telephone Encounter (Signed)
Called for her Vyvanse to be called in. Reviewed record and she should be out. Will notify Dr Ronne Binning to get her a new rx.

## 2020-12-12 ENCOUNTER — Other Ambulatory Visit: Payer: Self-pay

## 2020-12-12 MED ORDER — LO LOESTRIN FE 1 MG-10 MCG / 10 MCG PO TABS: 1.0000 | ORAL_TABLET | Freq: Every day | ORAL | Status: DC

## 2020-12-15 ENCOUNTER — Telehealth (INDEPENDENT_AMBULATORY_CARE_PROVIDER_SITE_OTHER): Payer: 59 | Admitting: Psychiatry

## 2020-12-15 ENCOUNTER — Encounter (HOSPITAL_COMMUNITY): Payer: Self-pay | Admitting: Psychiatry

## 2020-12-15 DIAGNOSIS — F320 Major depressive disorder, single episode, mild: Secondary | ICD-10-CM | POA: Diagnosis not present

## 2020-12-15 DIAGNOSIS — F603 Borderline personality disorder: Secondary | ICD-10-CM | POA: Diagnosis not present

## 2020-12-15 DIAGNOSIS — F411 Generalized anxiety disorder: Secondary | ICD-10-CM

## 2020-12-15 DIAGNOSIS — F32A Depression, unspecified: Secondary | ICD-10-CM

## 2020-12-15 MED ORDER — HYDROXYZINE HCL 10 MG PO TABS: 10.0000 mg | ORAL_TABLET | Freq: Three times a day (TID) | ORAL | 2 refills | Status: DC | PRN

## 2020-12-15 MED ORDER — QUETIAPINE FUMARATE 100 MG PO TABS: ORAL_TABLET | ORAL | 2 refills | Status: DC

## 2020-12-15 MED ORDER — DULOXETINE HCL 60 MG PO CPEP: 120.0000 mg | ORAL_CAPSULE | ORAL | 2 refills | Status: DC
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
## 2020-12-19 ENCOUNTER — Telehealth (HOSPITAL_COMMUNITY): Payer: Self-pay | Admitting: Psychiatry

## 2020-12-19 NOTE — Telephone Encounter (Signed)
Provider called patient mother to determined if patient had landed in [place] (after speaking with patient last week she noted that she would end her life when she got back to [facility]. Provider called [facility] police department and was informed nothing could be done). Patient mother notes that her daughter was still in [place]. She however noted that her daughter spoke to her father and was in high spirt's and noted that she did not believe she was a danger to her self or others. She notes that if her daughter seemed suicidal or makes threats she will bring her into [facility] to be evaluated. Provider also informed patients mother that she can call mobile crisis or 911 if assistance is needed. She endorsed understanding and agreed. No other concerns noted at this time.

## 2020-12-27 ENCOUNTER — Other Ambulatory Visit (HOSPITAL_COMMUNITY): Payer: Self-pay | Admitting: Psychiatry

## 2020-12-27 ENCOUNTER — Telehealth (HOSPITAL_COMMUNITY): Payer: Self-pay | Admitting: *Deleted

## 2020-12-27 MED ORDER — PROPRANOLOL HCL 10 MG PO TABS: 10.0000 mg | ORAL_TABLET | Freq: Three times a day (TID) | ORAL | 2 refills | Status: DC

## 2020-12-27 NOTE — Telephone Encounter (Signed)
Call from patient in tears stating she is having panic attacks frequently every day, and today is her third day without Xanax and the Hydroxzine is not helping. Will refer this concern to Metro Kung NP to follow up with her.

## 2020-12-27 NOTE — Telephone Encounter (Signed)
Provider spoke to the patient and was informed that over the last week she has been more anxious.  She informed Probation officer that she is overwhelmed with life.  She notes that work is stressful and she will be moving back to [place] in September but is uncertain of if she will be successful there.  She informed Probation officer that she has been having panic attacks daily and notes that hydroxyzine has been ineffective.  She notes that when she takes hydroxyzine she is unsure if her heart is racing faster because she is having in reaction to the medications or if her panic attack is worsening.  At this time she notes that she would like to discontinue hydroxyzine.  She will start her propanolol 10 mg 3 times daily to help manage anxiety. Potential side effects of medication and risks vs benefits of treatment vs non-treatment were explained and discussed. All questions were answered. No other concerns noted at this time.

## 2020-12-28 ENCOUNTER — Ambulatory Visit (HOSPITAL_COMMUNITY)
Admission: EM | Admit: 2020-12-28 | Discharge: 2020-12-28 | Disposition: A | Discharge status: Home / Self Care | Payer: 59

## 2020-12-28 ENCOUNTER — Telehealth (HOSPITAL_COMMUNITY): Payer: Self-pay | Admitting: Psychiatry

## 2020-12-28 ENCOUNTER — Other Ambulatory Visit: Payer: Self-pay

## 2020-12-28 DIAGNOSIS — F603 Borderline personality disorder: Secondary | ICD-10-CM | POA: Diagnosis not present

## 2020-12-28 DIAGNOSIS — F411 Generalized anxiety disorder: Secondary | ICD-10-CM

## 2020-12-28 DIAGNOSIS — F410 Panic disorder [episodic paroxysmal anxiety] without agoraphobia: Secondary | ICD-10-CM | POA: Insufficient documentation
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
## 2020-12-28 NOTE — Discharge Instructions (Signed)
Pt discharged to home/self care. Given opportunity to ask questions.  Endorses chronic passive SI. Denies HI, AVH.  Patient is also instructed prior to discharge to: Take all medications as prescribed by her mental healthcare provider. Report any adverse effects and or reactions from the medicines to her outpatient provider promptly. Patient has been instructed & cautioned: To not engage in alcohol and or illegal drug use while on prescription medicines. In the event of worsening symptoms, patient is instructed to call the crisis hotline, 911 and or go to the nearest ED for appropriate evaluation and treatment of symptoms. To follow-up with her primary care provider for your other medical issues, concerns and or health care needs.

## 2020-12-28 NOTE — Telephone Encounter (Signed)
Patient canceled all upcoming appointments for OP Therapy and "will not schedule"  any additional visits with psychiatry for "lack of care" per patient.    Pt presented this morning as a walk-in. Pt reported coming off xanex and starting hydroxyzine is not the right thing to do and then adding propranol 3 times a day is making her have excessive panic attacks and "really bad thoughts about hurting herself."   Upon the advice of provider's RN- escorted patient to [facility]. Patient returned to OP dept to advise urgent care staff did nothing for her and her psychiatrist is not a good psychiatrist so she will not be back.

## 2020-12-28 NOTE — ED Provider Notes (Signed)
Behavioral Health Urgent Care Medical Screening Exam  Patient Name: Mary Kane MRN: [id number] Date of Evaluation: 12/28/20 Chief Complaint:   Diagnosis:  Final diagnoses:  Generalized anxiety disorder  Borderline personality disorder ([place])    History of Present illness: Mary Kane is a 30 y.o. female.  With Past history of borderline personality disorder, generalized anxiety disorder, depression, ADHD, passive suicidal ideations presented walk-in to [facility] for anxiety and panic attacks asking for Xanax.  Patient is highly anxious and irritable.  Patient states she was taken off Xanax 10 days ago by her doctor and she has been having multiple panic attack since then.  She feels abandoned, stressed as nobody prescribing her Xanax.  Patient states she really needed Xanax because she has been going through stressful time in her life.  She states she is close to quitting her job. She reports trying hydroxyzine but it did not work.  She has been prescribed propanolol recently but has not tried it.  She states she does not want to take propanolol 3 times daily for anxiety and just want Xanax.  Patient states she has been having chronic SI for last 2 years without any plan.  She denies HI and AVH. When patient was told that she will not get Xanax here. She got angry and irritable.  She states" you do not know what panic attack means and you just wasted my 2 hours sitting here if you cannot prescribe the Xanax."  Patient asked to leave saying" I just want to go and do not want to waste my time with you."  Patient was instructed that she can take propanolol as needed for panic attack and anxiety.  Patient insisted on leaving. Chart review shows that patient had been on Xanax for over 1 year and was recently taken off Xanax due to potential dependency and started on hydroxyzine and propanolol for anxiety.  At that time patient's mother was contacted by provider and she stated that patient has made threats  in the past when things were not going her way to manipulate situations.  It was also noted that patient has cutting behaviors in the past and chronic SI for last 2 years.   Psychiatric Specialty Exam  Presentation  General Appearance:Appropriate for Environment; Casual  Eye Contact:Good  Speech:Clear and Coherent  Speech Volume:Increased  Handedness:Right   Mood and Affect  Mood:Anxious; Irritable  Affect:Congruent   Thought Process  Thought Processes:Coherent  Descriptions of Associations:Intact  Orientation:Full (Time, Place and Person)  Thought Content:Logical  Diagnosis of Schizophrenia or Schizoaffective disorder in past: No   Hallucinations:None  Ideas of Reference:None  Suicidal Thoughts:Yes, Passive Without Intent; Without Plan  Homicidal Thoughts:No   Sensorium  Memory:Other (comment) (Not able to assess, Pt uncooperative)  Judgment:Fair  Insight:Fair   Executive Functions  Concentration:Fair  Attention Span:Fair  [place]   Psychomotor Activity  Psychomotor Activity:Increased   Assets  Assets:Communication Skills; Desire for Improvement; Financial Resources/Insurance; Housing; Physical Health; Social Support   Sleep  Sleep:Fair  Number of hours: 9   No data recorded  Physical Exam: Physical Exam Vitals and nursing note reviewed.  Constitutional:      General: She is in acute distress.     Appearance: Normal appearance. She is not ill-appearing, toxic-appearing or diaphoretic.  HENT:     Head: Normocephalic and atraumatic.  Pulmonary:     Effort: Pulmonary effort is normal.  Neurological:     General: No focal deficit present.  Mental Status: She is alert and oriented to person, place, and time.   Review of Systems  Reason unable to perform ROS: Pt uncooperative.  Blood pressure (!) 153/119, pulse 96, temperature 98.7 F (37.1 C), temperature source Oral, resp. rate 16,  SpO2 100 %. There is no height or weight on file to calculate BMI.  Musculoskeletal: Strength & Muscle Tone: within normal limits Gait & Station: normal Patient leans: N/A   [place] [facility] Discharge Disposition for Follow up and Recommendations: Based on my evaluation the patient does not appear to have an emergency medical condition and can be discharged with resources and follow up care in outpatient services for Medication Management Patient does not meet criteria for inpatient hospitalization.  Patient not actively suicidal.  Patient has chronic SI for last 2 years Without a plan or intent.  Patient not homicidal, or psychotic.  -Can take propanolol 10 mg as needed for panic attacks/anxiety. -Continue hydroxyzine 25 mg 3 times daily as needed for anxiety. -Continue Cymbalta, Seroquel for depression. -Follow-up with outpatient psychiatry provider.   Armando Reichert, MD 12/28/2020, 10:11 AM

## 2020-12-28 NOTE — Telephone Encounter (Signed)
Thank you for this update.  Provider has acknowledged message to expect patient's choice to receive care elsewhere.  If she changes her mind she is always welcome to come back to the facility.

## 2020-12-28 NOTE — Discharge Summary (Signed)
Mary Kane to be D/C'd Home per MD order. Discussed with the patient and all questions fully answered. An After Visit Summary was printed and given to the patient.  Patient escorted out and D/C home via private auto.  Clois Dupes  12/28/2020 10:16 AM

## 2021-01-04 ENCOUNTER — Ambulatory Visit (HOSPITAL_COMMUNITY): Payer: 59 | Admitting: Clinical

## 2021-01-04 ENCOUNTER — Other Ambulatory Visit (HOSPITAL_COMMUNITY): Payer: Self-pay | Admitting: Psychiatry

## 2021-01-04 ENCOUNTER — Telehealth (HOSPITAL_COMMUNITY): Payer: Self-pay | Admitting: *Deleted

## 2021-01-04 DIAGNOSIS — F900 Attention-deficit hyperactivity disorder, predominantly inattentive type: Secondary | ICD-10-CM

## 2021-01-04 MED ORDER — VYVANSE 50 MG PO CAPS: 50.0000 mg | ORAL_CAPSULE | ORAL | 0 refills | Status: DC

## 2021-01-04 NOTE — Telephone Encounter (Signed)
Medication refilled and sent to preferred pharmacy.  Patient will have to reschedule appointment for further medication refills.

## 2021-01-04 NOTE — Telephone Encounter (Signed)
Call from patient to request a refill of her Vyvanse . The clinics last contact with her was her cancelling her appts here and stating she was going to find another provider. Writer spoke with her and asked her to clarify her position as the last contact was her firing [place]. She stated she didn't mean to do any of that, she was in a "borderline melt down". She would like to remain here at this clinic and continue to see [facility] NP. Will notify her of this contact and see if she will call her refill in to her preferred pharmacy.

## 2021-01-05 ENCOUNTER — Telehealth (HOSPITAL_COMMUNITY): Payer: Self-pay | Admitting: *Deleted

## 2021-01-05 ENCOUNTER — Other Ambulatory Visit (HOSPITAL_COMMUNITY): Payer: Self-pay | Admitting: Psychiatry

## 2021-01-05 DIAGNOSIS — F900 Attention-deficit hyperactivity disorder, predominantly inattentive type: Secondary | ICD-10-CM

## 2021-01-05 MED ORDER — VYVANSE 50 MG PO CAPS: 50.0000 mg | ORAL_CAPSULE | ORAL | 0 refills | Status: DC

## 2021-01-05 NOTE — Telephone Encounter (Signed)
Patient called and scheduled appt with [facility] on 02/17/21 virtual.

## 2021-01-05 NOTE — Telephone Encounter (Signed)
Medication refilled.  Dr. Eduard Clos DEA submitted with prescription.

## 2021-01-05 NOTE — Telephone Encounter (Signed)
VM from patient stating the Vyvanse Rx that was e signed to her pharmacy is saying the rx has to have the supervising Dr name and DEA number on it. I called her pharmacy to clarify this and I was told by pharm staff this is true, its a new DEA regulation. Will ask Eulis Canner to amend the rx to include this.

## 2021-01-09 ENCOUNTER — Encounter (HOSPITAL_COMMUNITY): Payer: Self-pay | Admitting: *Deleted

## 2021-01-09 ENCOUNTER — Emergency Department (HOSPITAL_COMMUNITY)
Admission: EM | Admit: 2021-01-09 | Discharge: 2021-01-09 | Disposition: A | Discharge status: Home / Self Care | Payer: 59 | Attending: Emergency Medicine | Admitting: Emergency Medicine

## 2021-01-09 ENCOUNTER — Emergency Department (HOSPITAL_COMMUNITY): Payer: 59

## 2021-01-09 ENCOUNTER — Other Ambulatory Visit: Payer: Self-pay

## 2021-01-09 DIAGNOSIS — H538 Other visual disturbances: Secondary | ICD-10-CM | POA: Diagnosis not present

## 2021-01-09 DIAGNOSIS — R519 Headache, unspecified: Secondary | ICD-10-CM | POA: Insufficient documentation

## 2021-01-09 DIAGNOSIS — F17210 Nicotine dependence, cigarettes, uncomplicated: Secondary | ICD-10-CM | POA: Insufficient documentation

## 2021-01-09 DIAGNOSIS — F606 Avoidant personality disorder: Secondary | ICD-10-CM | POA: Diagnosis not present

## 2021-01-09 NOTE — ED Triage Notes (Signed)
Pt complains of headache x 1 weeks. Pain is behind right eye. Also has sensitivity to light.

## 2021-01-09 NOTE — ED Notes (Signed)
An After Visit Summary was printed and given to the patient. Discharge instructions given and no further questions at this time.  

## 2021-01-09 NOTE — ED Provider Notes (Signed)
Emergency Medicine Provider Triage Evaluation Note  Mary Kane , a 30 y.o. female  was evaluated in triage.  Pt complains of headache, constant over the past 1 week.  Patient states that she feels a line of pain across the top of her head, from front to back, more so on the right side, that ends in an area of significant tenderness posteriorly.  No lumps or bumps.  She states that last night she was pushing on the area and it made her vision change.  No associated vomiting or fever.  No confusion.  She states that she feels less coordinated than normal.  Review of Systems  Positive: Headache, visual disturbance Negative: Fever, vomiting  Physical Exam  BP (!) 143/96 (BP Location: Left Arm)   Pulse (!) 108   Temp 99.5 F (37.5 C) (Oral)   Resp 16   SpO2 100%  Gen:   Awake, no distress   Resp:  Normal effort  MSK:   Moves extremities without difficulty  Other:  Gross neuro exam, testing cranial nerves, motor and sensation, intact   Medical Decision Making  Medically screening exam initiated at 11:09 AM.  Appropriate orders placed.  Mary Kane was informed that the remainder of the evaluation will be completed by another provider, this initial triage assessment does not replace that evaluation, and the importance of remaining in the ED until their evaluation is complete.     Carlisle Cater, PA-C 01/09/21 1110    Arnaldo Natal, MD 01/09/21 1430

## 2021-01-09 NOTE — Discharge Instructions (Signed)
Please read and follow all provided instructions.  Your diagnoses today include:  1. Acute nonintractable headache, unspecified headache type     Tests performed today include: CT of your head which was normal and did not show any serious cause of your headache Vital signs. See below for your results today.   Medications:  None  Take any prescribed medications only as directed.  Additional information:  Follow any educational materials contained in this packet.  You are having a headache. No specific cause was found today for your headache. It may have been a migraine or other cause of headache. Stress, anxiety, fatigue, and depression are common triggers for headaches.   Your headache today does not appear to be life-threatening or require hospitalization, but often the exact cause of headaches is not determined in the emergency department. Therefore, follow-up with your doctor is very important to find out what may have caused your headache and whether or not you need any further diagnostic testing or treatment.   Sometimes headaches can appear benign (not harmful), but then more serious symptoms can develop which should prompt an immediate re-evaluation by your doctor or the emergency department.  BE VERY CAREFUL not to take multiple medicines containing Tylenol (also called acetaminophen). Doing so can lead to an overdose which can damage your liver and cause liver failure and possibly death.   Follow-up instructions: Please follow-up with your primary care provider in the next 3 days for further evaluation of your symptoms.   Return instructions:  Please return to the Emergency Department if you experience worsening symptoms. Return if the medications do not resolve your headache, if it recurs, or if you have multiple episodes of vomiting or cannot keep down fluids. Return if you have a change from the usual headache. RETURN IMMEDIATELY IF you: Develop a sudden, severe  headache Develop confusion or become poorly responsive or faint Develop a fever above 100.93F or problem breathing Have a change in speech, vision, swallowing, or understanding Develop new weakness, numbness, tingling, incoordination in your arms or legs Have a seizure Please return if you have any other emergent concerns.  Additional Information:  Your vital signs today were: BP (!) 133/112   Pulse (!) 111   Temp 99.5 F (37.5 C) (Oral)   Resp 16   SpO2 100%  If your blood pressure (BP) was elevated above 135/85 this visit, please have this repeated by your doctor within one month. --------------

## 2021-01-09 NOTE — ED Provider Notes (Signed)
[facility] DEPT Provider Note   CSN: [id number] Arrival date & time: 01/09/21  1041     History Chief Complaint  Patient presents with   Headache    Mary Kane is a 30 y.o. female.  Patient with no significant PMP presents with headache, constant over the past 1 week.  Patient states that she feels a line of pain across the top of her head, from front to back, more so on the right side, that ends in an area of significant tenderness posteriorly.  No lumps or bumps.  She states that last night she was pushing on the area and it made her vision change.  No associated vomiting or fever.  No confusion.  She states that she feels less coordinated than normal.  No preceding injuries.  Patient denies signs of stroke including: facial droop, slurred speech, aphasia, weakness/numbness in extremities, trouble walking. The onset of this condition was acute. The course is constant. Aggravating factors: none. Alleviating factors: none.        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   Dermoid cyst of ovary, right     Past Surgical History:  Procedure Laterality Date   LAPAROSCOPIC OVARIAN CYSTECTOMY Right 12/29/2019   Procedure: Laparoscopic right oophorectomy;  Surgeon: Mora Bellman, MD;  Location: [facility];  Service: Gynecology;  Laterality: Right;   RHINOPLASTY     WISDOM TOOTH EXTRACTION       OB History     Gravida  0   Para  0   Term  0    Preterm  0   AB  0   Living  0      SAB  0   IAB  0   Ectopic  0   Multiple  0   Live Births  0           Family History  Problem Relation Age of Onset   Chromosomal disorder Father    COPD Father     Social History   Tobacco Use   Smoking status: Every Day    Packs/day: 0.50    Years: 2.00    Pack years: 1.00    Types: Cigarettes   Smokeless tobacco: Never  Vaping Use   Vaping Use: Never used  Substance Use Topics   Alcohol use: Yes    Comment: socially   Drug use: Yes    Types: Marijuana    Comment: nightly    Home Medications Prior to Admission medications   Medication Sig Start Date End Date Taking? Authorizing Provider  DULoxetine (CYMBALTA) 60 MG capsule Take 2 capsules (120 mg total) by mouth every morning. 12/15/20   Salley Slaughter, NP  hydrOXYzine (ATARAX/VISTARIL) 10 MG tablet Take 1 tablet (10 mg total) by mouth 3 (three) times daily as needed. 12/15/20   Eulis Canner E, NP  LO LOESTRIN FE 1 MG-10 MCG / 10 MCG tablet Take 1 tablet  by mouth daily. 12/12/20   Shelly Bombard, MD  norethindrone-ethinyl estradiol (JUNEL FE 1/20) 1-20 MG-MCG tablet Take 1 tablet by mouth daily. 04/18/20   Constant, Peggy, MD  propranolol (INDERAL) 10 MG tablet Take 1 tablet (10 mg total) by mouth 3 (three) times daily. 12/27/20   Salley Slaughter, NP  QUEtiapine (SEROQUEL) 100 MG tablet TAKE 1 TABLET BY MOUTH EVERYDAY AT BEDTIME 12/15/20   Salley Slaughter, NP  VYVANSE 50 MG capsule Take 1 capsule (50 mg total) by mouth every morning. 01/05/21   Salley Slaughter, NP    Allergies    Patient has no known allergies.  Review of Systems   Review of Systems  Constitutional:  Negative for fever.  HENT:  Negative for congestion, dental problem, rhinorrhea and sinus pressure.   Eyes:  Positive for visual disturbance. Negative for photophobia, discharge and redness.  Respiratory:  Negative for shortness of breath.   Cardiovascular:  Negative for chest  pain.  Gastrointestinal:  Negative for nausea and vomiting.  Musculoskeletal:  Negative for gait problem, neck pain and neck stiffness.  Skin:  Negative for rash.  Neurological:  Positive for headaches. Negative for syncope, speech difficulty, weakness, light-headedness and numbness.  Psychiatric/Behavioral:  Negative for confusion.    Physical Exam Updated Vital Signs BP (!) 138/104   Pulse 88   Temp 98.8 F (37.1 C) (Oral)   Resp 12   SpO2 100%   Physical Exam Vitals and nursing note reviewed.  Constitutional:      Appearance: She is well-developed.  HENT:     Head: Normocephalic and atraumatic.     Right Ear: Tympanic membrane, ear canal and external ear normal.     Left Ear: Tympanic membrane, ear canal and external ear normal.     Nose: Nose normal.     Mouth/Throat:     Pharynx: Uvula midline.  Eyes:     General: Lids are normal.     Extraocular Movements:     Right eye: No nystagmus.     Left eye: No nystagmus.     Conjunctiva/sclera: Conjunctivae normal.     Pupils: Pupils are equal, round, and reactive to light.  Cardiovascular:     Rate and Rhythm: Normal rate and regular rhythm.  Pulmonary:     Effort: Pulmonary effort is normal.     Breath sounds: Normal breath sounds.  Abdominal:     Palpations: Abdomen is soft.     Tenderness: There is no abdominal tenderness.  Musculoskeletal:     Cervical back: Normal range of motion and neck supple. No tenderness or bony tenderness.  Skin:    General: Skin is warm and dry.  Neurological:     Mental Status: She is alert and oriented to person, place, and time.     GCS: GCS eye subscore is 4. GCS verbal subscore is 5. GCS motor subscore is 6.     Cranial Nerves: No cranial nerve deficit.     Sensory: No sensory deficit.     Motor: No weakness.     Coordination: Coordination normal.     Gait: Gait normal.     Comments:    Psychiatric:        Mood and Affect: Mood is anxious.    ED Results / Procedures /  Treatments   Labs (all labs ordered are listed, but only abnormal results are displayed) Labs Reviewed - No data to display  EKG None  Radiology CT Head Wo Contrast  Result Date: 01/09/2021 CLINICAL DATA:  Headache, chronic, new features or increased frequency; R sided head pain, vision change. Additional history provided: Patient reports headache for 1 week, pain behind right eye, sensitivity to light, nausea. EXAM: CT HEAD WITHOUT CONTRAST TECHNIQUE: Contiguous axial images were obtained from the base of the skull through the vertex without intravenous contrast. COMPARISON:  No pertinent prior exams available for comparison. FINDINGS: Brain: Cerebral volume is normal. There is no acute intracranial hemorrhage. No demarcated cortical infarct. No extra-axial fluid collection. No evidence of an intracranial mass. No midline shift. Vascular: No hyperdense vessel. Skull: Normal. Negative for fracture or focal lesion. Sinuses/Orbits: Visualized orbits show no acute finding. No significant paranasal sinus disease at the imaged levels. IMPRESSION: Unremarkable non-contrast CT appearance of the brain. No evidence of acute intracranial abnormality. Electronically Signed   By: Kellie Simmering DO   On: 01/09/2021 11:45    Procedures Procedures   Medications Ordered in ED Medications - No data to display  ED Course  I have reviewed the triage vital signs and the nursing notes.  Pertinent labs & imaging results that were available during my care of the patient were reviewed by me and considered in my medical decision making (see chart for details).  Patient seen and examined. Work-up initiated.   Vital signs reviewed and are as follows: BP (!) 138/104   Pulse 88   Temp 98.8 F (37.1 C) (Oral)   Resp 12   SpO2 100%   12:45 PM CT neg. Pt updated on results.   Plan for discharge to home.  Encouraged OTC meds.  Encourage neurology follow-up if desired, or she may follow-up with PCP.  Patient  counseled to return if they have weakness in their arms or legs, slurred speech, trouble walking or talking, confusion, trouble with their balance, or if they have any other concerns. Patient verbalizes understanding and agrees with plan.      MDM Rules/Calculators/A&P                           Patient with new HA, reported vision change, no vision loss.  Patient without other high-risk features of headache including: sudden onset/thunderclap HA, altered mental status, accompanying seizure, headache with exertion, age > 9, history of immunocompromise, neck or shoulder pain, fever, use of anticoagulation, family history of spontaneous SAH, concomitant drug use, toxic exposure.   Patient has a normal complete neurological exam, normal vital signs, normal level of consciousness, no signs of meningismus, is well-appearing/non-toxic appearing, no signs of trauma.   Noncontrast CT ordered and is negative.  No dangerous or life-threatening conditions suspected or identified by history, physical exam, and by work-up. No indications for hospitalization identified.   Final Clinical Impression(s) / ED Diagnoses Final diagnoses:  Acute nonintractable headache, unspecified headache type    Rx / DC Orders ED Discharge Orders     None        Carlisle Cater, PA-C 01/09/21 1247    Arnaldo Natal, MD 01/09/21 1431

## 2021-01-12 ENCOUNTER — Observation Stay (HOSPITAL_COMMUNITY)
Admission: EM | Admit: 2021-01-12 | Discharge: 2021-01-13 | Disposition: A | Discharge status: Home / Self Care | Payer: 59 | Attending: Family Medicine | Admitting: Family Medicine

## 2021-01-12 ENCOUNTER — Emergency Department (HOSPITAL_COMMUNITY): Payer: 59

## 2021-01-12 ENCOUNTER — Ambulatory Visit: Payer: 59 | Admitting: Neurology

## 2021-01-12 ENCOUNTER — Other Ambulatory Visit: Payer: Self-pay

## 2021-01-12 ENCOUNTER — Encounter: Payer: Self-pay | Admitting: Neurology

## 2021-01-12 ENCOUNTER — Encounter (HOSPITAL_COMMUNITY): Payer: Self-pay

## 2021-01-12 VITALS — BP 115/81 | HR 91 | Ht 66.0 in | Wt 163.0 lb

## 2021-01-12 DIAGNOSIS — F17210 Nicotine dependence, cigarettes, uncomplicated: Secondary | ICD-10-CM | POA: Insufficient documentation

## 2021-01-12 DIAGNOSIS — I639 Cerebral infarction, unspecified: Principal | ICD-10-CM | POA: Diagnosis present

## 2021-01-12 DIAGNOSIS — Y900 Blood alcohol level of less than 20 mg/100 ml: Secondary | ICD-10-CM | POA: Diagnosis not present

## 2021-01-12 DIAGNOSIS — H538 Other visual disturbances: Secondary | ICD-10-CM | POA: Diagnosis not present

## 2021-01-12 DIAGNOSIS — Z79899 Other long term (current) drug therapy: Secondary | ICD-10-CM | POA: Insufficient documentation

## 2021-01-12 DIAGNOSIS — R519 Headache, unspecified: Secondary | ICD-10-CM | POA: Diagnosis not present

## 2021-01-12 DIAGNOSIS — R9089 Other abnormal findings on diagnostic imaging of central nervous system: Secondary | ICD-10-CM | POA: Diagnosis present

## 2021-01-12 DIAGNOSIS — Z20822 Contact with and (suspected) exposure to covid-19: Secondary | ICD-10-CM | POA: Insufficient documentation

## 2021-01-12 DIAGNOSIS — R9402 Abnormal brain scan: Secondary | ICD-10-CM | POA: Diagnosis not present

## 2021-01-12 DIAGNOSIS — G4452 New daily persistent headache (NDPH): Secondary | ICD-10-CM

## 2021-01-12 DIAGNOSIS — F603 Borderline personality disorder: Secondary | ICD-10-CM | POA: Diagnosis present

## 2021-01-12 DIAGNOSIS — E785 Hyperlipidemia, unspecified: Secondary | ICD-10-CM | POA: Diagnosis present

## 2021-01-12 LAB — COMPREHENSIVE METABOLIC PANEL
ALT: 16 U/L (ref 0–44)
AST: 19 U/L (ref 15–41)
Albumin: 3.9 g/dL (ref 3.5–5.0)
Alkaline Phosphatase: 43 U/L (ref 38–126)
Anion gap: 9 (ref 5–15)
BUN: 7 mg/dL (ref 6–20)
CO2: 24 mmol/L (ref 22–32)
Calcium: 8.7 mg/dL — ABNORMAL LOW (ref 8.9–10.3)
Chloride: 105 mmol/L (ref 98–111)
Creatinine, Ser: 0.65 mg/dL (ref 0.44–1.00)
GFR, Estimated: >60 mL/min (ref >60)
Glucose, Bld: 93 mg/dL (ref 70–99)
Potassium: 4.2 mmol/L (ref 3.5–5.1)
Sodium: 138 mmol/L (ref 135–145)
Total Bilirubin: 0.5 mg/dL (ref 0.3–1.2)
Total Protein: 6.6 g/dL (ref 6.5–8.1)

## 2021-01-12 LAB — CSF CELL COUNT WITH DIFFERENTIAL
RBC Count, CSF: 1 /mm3 — ABNORMAL HIGH
RBC Count, CSF: 27 /mm3 — ABNORMAL HIGH
Tube #: 1
Tube #: 4
WBC, CSF: 1 /mm3 (ref 0–5)
WBC, CSF: 2 /mm3 (ref 0–5)

## 2021-01-12 LAB — URINALYSIS, ROUTINE W REFLEX MICROSCOPIC
Bilirubin Urine: NEGATIVE
Glucose, UA: NEGATIVE mg/dL
Hgb urine dipstick: NEGATIVE
Ketones, ur: NEGATIVE mg/dL
Nitrite: NEGATIVE
Protein, ur: NEGATIVE mg/dL
Specific Gravity, Urine: 1.009 (ref 1.005–1.030)
pH: 9 — ABNORMAL HIGH (ref 5.0–8.0)

## 2021-01-12 LAB — RAPID URINE DRUG SCREEN, HOSP PERFORMED
Amphetamines: NOT DETECTED
Barbiturates: NOT DETECTED
Benzodiazepines: NOT DETECTED
Cocaine: NOT DETECTED
Opiates: NOT DETECTED
Tetrahydrocannabinol: POSITIVE — AB

## 2021-01-12 LAB — CBC
HCT: 40.2 % (ref 36.0–46.0)
Hemoglobin: 12.7 g/dL (ref 12.0–15.0)
MCH: 28.1 pg (ref 26.0–34.0)
MCHC: 31.6 g/dL (ref 30.0–36.0)
MCV: 88.9 fL (ref 80.0–100.0)
Platelets: 263 10*3/uL (ref 150–400)
RBC: 4.52 MIL/uL (ref 3.87–5.11)
RDW: 13.2 % (ref 11.5–15.5)
WBC: 8.6 10*3/uL (ref 4.0–10.5)
nRBC: 0 % (ref 0.0–0.2)

## 2021-01-12 LAB — CRYPTOCOCCAL ANTIGEN, CSF: Crypto Ag: NEGATIVE

## 2021-01-12 LAB — I-STAT CHEM 8, ED
BUN: 6 mg/dL (ref 6–20)
Calcium, Ion: 1.15 mmol/L (ref 1.15–1.40)
Chloride: 103 mmol/L (ref 98–111)
Creatinine, Ser: 0.8 mg/dL (ref 0.44–1.00)
Glucose, Bld: 92 mg/dL (ref 70–99)
HCT: 43 % (ref 36.0–46.0)
Hemoglobin: 14.6 g/dL (ref 12.0–15.0)
Potassium: 4.1 mmol/L (ref 3.5–5.1)
Sodium: 137 mmol/L (ref 135–145)
TCO2: 25 mmol/L (ref 22–32)

## 2021-01-12 LAB — DIFFERENTIAL
Abs Immature Granulocytes: 0.02 10*3/uL (ref 0.00–0.07)
Basophils Absolute: 0 10*3/uL (ref 0.0–0.1)
Basophils Relative: 0 %
Eosinophils Absolute: 0.2 10*3/uL (ref 0.0–0.5)
Eosinophils Relative: 3 %
Immature Granulocytes: 0 %
Lymphocytes Relative: 38 %
Lymphs Abs: 3.3 10*3/uL (ref 0.7–4.0)
Monocytes Absolute: 0.5 10*3/uL (ref 0.1–1.0)
Monocytes Relative: 6 %
Neutro Abs: 4.5 10*3/uL (ref 1.7–7.7)
Neutrophils Relative %: 53 %

## 2021-01-12 LAB — I-STAT BETA HCG BLOOD, ED (MC, WL, AP ONLY): I-stat hCG, quantitative: <5 m[IU]/mL (ref <5)

## 2021-01-12 LAB — APTT: aPTT: 29 seconds (ref 24–36)

## 2021-01-12 LAB — RESP PANEL BY RT-PCR (FLU A&B, COVID) ARPGX2
Influenza A by PCR: NEGATIVE
Influenza B by PCR: NEGATIVE
SARS Coronavirus 2 by RT PCR: NEGATIVE

## 2021-01-12 LAB — PROTEIN AND GLUCOSE, CSF
Glucose, CSF: 59 mg/dL (ref 40–70)
Total  Protein, CSF: 18 mg/dL (ref 15–45)

## 2021-01-12 LAB — HIV ANTIBODY (ROUTINE TESTING W REFLEX): HIV Screen 4th Generation wRfx: NONREACTIVE

## 2021-01-12 LAB — PROTIME-INR
INR: 0.9 (ref 0.8–1.2)
Prothrombin Time: 12.6 seconds (ref 11.4–15.2)

## 2021-01-12 LAB — SEDIMENTATION RATE: Sed Rate: 8 mm/hr (ref 0–22)

## 2021-01-12 LAB — ANTITHROMBIN III: AntiThromb III Func: 93 % (ref 75–120)

## 2021-01-12 LAB — ETHANOL: Alcohol, Ethyl (B): <10 mg/dL (ref <10)

## 2021-01-12 MED ORDER — LORAZEPAM 2 MG/ML IJ SOLN
1.0000 mg | Freq: Once | INTRAMUSCULAR | Status: AC | PRN
  Administered 2021-01-12: 1 mg via INTRAVENOUS
  Filled 2021-01-12: qty 1

## 2021-01-12 MED ORDER — ACETAMINOPHEN 650 MG RE SUPP: 650.0000 mg | RECTAL | Status: DC | PRN

## 2021-01-12 MED ORDER — LIDOCAINE HCL (PF) 1 % IJ SOLN
30.0000 mL | Freq: Once | INTRAMUSCULAR | Status: AC
  Administered 2021-01-12: 30 mL
  Filled 2021-01-12: qty 30

## 2021-01-12 MED ORDER — GADOBUTROL 1 MMOL/ML IV SOLN
7.0000 mL | Freq: Once | INTRAVENOUS | Status: AC | PRN
  Administered 2021-01-12: 7 mL via INTRAVENOUS

## 2021-01-12 MED ORDER — LISDEXAMFETAMINE DIMESYLATE 50 MG PO CAPS: 50.0000 mg | ORAL_CAPSULE | ORAL | Status: DC

## 2021-01-12 MED ORDER — ASPIRIN EC 81 MG PO TBEC
81.0000 mg | DELAYED_RELEASE_TABLET | Freq: Every day | ORAL | Status: DC
  Administered 2021-01-12 – 2021-01-13 (×2): 81 mg via ORAL
  Filled 2021-01-12 (×2): qty 1

## 2021-01-12 MED ORDER — IOHEXOL 350 MG/ML SOLN
100.0000 mL | Freq: Once | INTRAVENOUS | Status: AC | PRN
  Administered 2021-01-12: 75 mL via INTRAVENOUS

## 2021-01-12 MED ORDER — ACETAMINOPHEN 325 MG PO TABS: 650.0000 mg | ORAL_TABLET | ORAL | Status: DC | PRN

## 2021-01-12 MED ORDER — DULOXETINE HCL 30 MG PO CPEP
120.0000 mg | ORAL_CAPSULE | ORAL | Status: DC
Start: 2021-01-13 — End: 2021-01-13
  Administered 2021-01-13: 120 mg via ORAL
  Filled 2021-01-12: qty 4

## 2021-01-12 MED ORDER — ACETAMINOPHEN 160 MG/5ML PO SOLN: 650.0000 mg | ORAL | Status: DC | PRN

## 2021-01-12 MED ORDER — SODIUM CHLORIDE 0.9 % IV SOLN: INTRAVENOUS | Status: DC

## 2021-01-12 MED ORDER — LORAZEPAM 2 MG/ML IJ SOLN
1.0000 mg | Freq: Once | INTRAMUSCULAR | Status: AC
  Administered 2021-01-12: 1 mg via INTRAVENOUS
  Filled 2021-01-12: qty 1

## 2021-01-12 MED ORDER — HYDROXYZINE HCL 10 MG PO TABS
10.0000 mg | ORAL_TABLET | Freq: Three times a day (TID) | ORAL | Status: DC | PRN
  Filled 2021-01-12: qty 1

## 2021-01-12 MED ORDER — PROPRANOLOL HCL 20 MG PO TABS
10.0000 mg | ORAL_TABLET | Freq: Three times a day (TID) | ORAL | Status: DC
  Administered 2021-01-12 – 2021-01-13 (×2): 10 mg via ORAL
  Filled 2021-01-12 (×4): qty 0.5

## 2021-01-12 MED ORDER — DROPERIDOL 2.5 MG/ML IJ SOLN
1.2500 mg | Freq: Once | INTRAMUSCULAR | Status: AC
  Administered 2021-01-12: 1.25 mg via INTRAVENOUS
  Filled 2021-01-12: qty 2

## 2021-01-12 MED ORDER — QUETIAPINE FUMARATE 100 MG PO TABS
100.0000 mg | ORAL_TABLET | Freq: Every day | ORAL | Status: DC
  Administered 2021-01-12: 100 mg via ORAL
  Filled 2021-01-12: qty 1

## 2021-01-12 MED ORDER — STROKE: EARLY STAGES OF RECOVERY BOOK
Freq: Once | Status: DC
  Filled 2021-01-12 (×2): qty 1

## 2021-01-12 NOTE — ED Notes (Signed)
Pt in MRI will update vitals when pt returns.

## 2021-01-12 NOTE — ED Notes (Signed)
Pt ambulated to restroom independently, brought pt blanket, pt resting in bed, in NAD

## 2021-01-12 NOTE — Consult Note (Signed)
Neurology Consultation  Reason for Consult: Headache, stroke Referring Physician: Dr. Hal Hope  CC: Headache, blurred vision  History is obtained from: Patient, chart  HPI: Mary Kane is a 30 y.o. female past medical history of ADD, anxiety, depression, tobacco abuse, on OCPs containing estrogen, t presented to the emergency room for evaluation of about 10 to 15 days of headache.  She describes the headaches mostly on the right side but then they become holocephalic as well.  She did see some vision changes-flashing lights at times.  Associated photophobia.  No phonophobia. Her headache is constant, not throbbing in nature, dull pain.  No vomiting.  She drinks 2-3 coffees every day. Recently had Xanax switched to propofol and hydroxyzine for her anxiety.  Denies any tingling or numbness currently but said that she was feeling tingly in all 4 extremities that has resolved.  No history of prior migraines. Reports intermittent dizziness-has not lightheadedness.  She is also a smoker-smokes about 6 to 7 cigarettes/day. No slurred speech or word finding difficulty. She underwent a spinal tap in the ER-normal protein normal cell count. MRI of the brain was abnormal-see details below.  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  ROS: Full ROS was performed and is negative except as noted in the HPI.   Past Medical History:  Diagnosis Date   ADD (attention deficit disorder)    Anxiety    Depression    Dermoid cyst    History of iron deficiency anemia    Insomnia    Mental disorder    Wears contact lenses    Wears glasses         Family History  Problem Relation Age of Onset   Chromosomal disorder Father    COPD Father    Headache Neg Hx    Migraines Neg Hx      Social History:   reports that she has been smoking cigarettes. She has a 0.80 pack-year smoking  history. She has never used smokeless tobacco. She reports current alcohol use. She reports current drug use. Drug: Marijuana.  Medications  Current Facility-Administered Medications:     stroke: mapping our early stages of recovery book, , Does not apply, Once, Rise Patience, MD   0.9 %  sodium chloride infusion, , Intravenous, Continuous, Rise Patience, MD   acetaminophen (TYLENOL) tablet 650 mg, 650 mg, Oral, Q4H PRN **OR** acetaminophen (TYLENOL) 160 MG/5ML solution 650 mg, 650 mg, Per Tube, Q4H PRN **OR** acetaminophen (TYLENOL) suppository 650 mg, 650 mg, Rectal, Q4H PRN, Rise Patience, MD   aspirin EC tablet 81 mg, 81 mg, Oral, Daily, Rise Patience, MD   [START ON 01/13/2021] DULoxetine (CYMBALTA) DR capsule 120 mg, 120 mg, Oral, BH-q7a, Rise Patience, MD   hydrOXYzine (ATARAX/VISTARIL) tablet 10 mg, 10 mg, Oral, TID PRN, Rise Patience, MD   propranolol (INDERAL) tablet 10 mg, 10 mg, Oral, TID, Rise Patience, MD   QUEtiapine (SEROQUEL) tablet 100 mg, 100 mg, Oral, QHS, Rise Patience, MD  Exam: Current vital signs: BP (!) 133/99 (BP Location: Right Arm)   Pulse 95   Temp 98.1 F (36.7 C) (Oral)   Resp 18   Ht '5\' 6"'$  (1.676 m)   Wt 72.6 kg   SpO2 100%   BMI 25.82 kg/m  Vital signs in last 24 hours: Temp:  [98 F (36.7 C)-98.1 F (  36.7 C)] 98.1 F (36.7 C) (07/28 1833) Pulse Rate:  [81-102] 95 (07/28 1833) Resp:  [16-22] 18 (07/28 1833) BP: (115-139)/(81-100) 133/99 (07/28 1833) SpO2:  [98 %-100 %] 100 % (07/28 1833) Weight:  [72.6 kg-73.9 kg] 72.6 kg (07/28 0900) General: Awake alert in no distress HEENT: Normocephalic, atraumatic, moist oral mucous membranes. CVs: Regular rate rhythm S1-S2 heard, no murmur rub gallop Chest is clear to auscultation Extremities are warm well perfused without edema. Skin is warm dry intact Abdomen is nondistended nontender Neurological exam Awake alert oriented x3. No  dysarthria No aphasia Slightly anxious. Cranial nerves II to XII intact. Motor exam with 5/5 strength and normal tone and range of motion in all 4 extremities. Sensation intact light touch without extinction. Cerebellar testing: No dysmetria.  No tremor. Gait testing deferred-according to the outpatient neurologist exam from earlier this morning, normal gait. NIH stroke scale-0  Labs I have reviewed labs in epic and the results pertinent to this consultation are:  CBC    Component Value Date/Time   WBC 8.6 01/12/2021 1348   RBC 4.52 01/12/2021 1348   HGB 12.7 01/12/2021 1348   HCT 40.2 01/12/2021 1348   PLT 263 01/12/2021 1348   MCV 88.9 01/12/2021 1348   MCH 28.1 01/12/2021 1348   MCHC 31.6 01/12/2021 1348   RDW 13.2 01/12/2021 1348   LYMPHSABS 3.3 01/12/2021 1348   MONOABS 0.5 01/12/2021 1348   EOSABS 0.2 01/12/2021 1348   BASOSABS 0.0 01/12/2021 1348    CMP     Component Value Date/Time   NA 138 01/12/2021 1348   K 4.2 01/12/2021 1348   CL 105 01/12/2021 1348   CO2 24 01/12/2021 1348   GLUCOSE 93 01/12/2021 1348   BUN 7 01/12/2021 1348   CREATININE 0.65 01/12/2021 1348   CALCIUM 8.7 (L) 01/12/2021 1348   PROT 6.6 01/12/2021 1348   ALBUMIN 3.9 01/12/2021 1348   AST 19 01/12/2021 1348   ALT 16 01/12/2021 1348   ALKPHOS 43 01/12/2021 1348   BILITOT 0.5 01/12/2021 1348   GFRNONAA >60 01/12/2021 1348   GFRAA >60 02/01/2016 2220   Imaging I have reviewed the images obtained: MRI brain with and without contrast shows multiple punctate foci of diffusion in the right occipital, right and possibly left parietal and frontal cortices suspicious for acute or subacute infarctions.  Given multiple vascular territories-cardioembolic etiology should be entertained. Small remote lacunar infarctions in the right cerebellum and right caudate nucleus.  CT angiography of the head and neck unremarkable.  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 Needs full work-up including stroke risk factor work-up and young Being on hormonal contraception including estrogen as well as smoking about the risk factors.  She is not a migraine or. Tobacco abuse is another risk factor that she has in addition to the above.  Recommendations: Admit to hospitalist Frequent neurochecks Telemetry Aspirin for now 2D echo-might need TEE if 2D echo is unremarkable Bilateral lower extremity Doppler. Hypercoagulable panel to include autoimmune conditions such as vasculitis and SLE in addition to genetic studies of prothrombin gene and factor V Leiden-already ordered and collected. Underwent spinal tap already-normal protein, normal cell count. May need  diagnostic cerebral angiogram based on the work-up and results that ensue.  CT angiography does not look suggestive of vasculitis, cerebrospinal fluid examination also is not supportive of a vasculitic diagnosis. PT, OT, speech therapy Bedside swallow screen Keep n.p.o. after midnight for possible TEE tomorrow after the TTE.  If the TEE does not happen tomorrow, and cannot be done over the weekend, she might need it as an outpatient-we will defer decision to the stroke team. Symptomatic management of the headache per primary team as you are Agree with discontinuing Vyvanse as well as estrogen-containing contraceptive pill. Counseled extensively on smoking cessation -- Amie Portland, MD Neurologist [facility] Pager: [phone]

## 2021-01-12 NOTE — ED Notes (Signed)
[facility] called to stat transfer pt to [facility]

## 2021-01-12 NOTE — ED Provider Notes (Signed)
[facility] DEPT Provider Note   CSN: [id number] Arrival date & time: 01/12/21  0848     History Chief Complaint  Patient presents with   Headache    Mary Kane is a 30 y.o. female.  The history is provided by the patient and medical records.  Headache Mary Kane is a 30 y.o. female who presents to the Emergency Department complaining of HA.  She presents to the ED for evaluation of 1.5 weeks of HA.  It is located from anterior to posterior head, slightly greater on the right.  Pain is constant. Has occasional blurry vision.  No N/V/D, fever.   Recently had anxiety meds changed by psychiatrist. Saw neurology today with plan for MRI to further evaluate.  Recently seen in ED and had negative CT head.      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   Dermoid cyst of ovary, right     Past Surgical History:  Procedure Laterality Date   LAPAROSCOPIC OVARIAN CYSTECTOMY Right 12/29/2019   Procedure: Laparoscopic right oophorectomy;  Surgeon: Mora Bellman, MD;  Location: [facility];  Service: Gynecology;  Laterality: Right;   RHINOPLASTY     WISDOM TOOTH EXTRACTION       OB History     Gravida  0   Para  0   Term  0   Preterm  0   AB  0   Living  0      SAB  0   IAB  0   Ectopic  0   Multiple  0   Live Births  0           Family  History  Problem Relation Age of Onset   Chromosomal disorder Father    COPD Father    Headache Neg Hx    Migraines Neg Hx     Social History   Tobacco Use   Smoking status: Every Day    Packs/day: 0.40    Years: 2.00    Pack years: 0.80    Types: Cigarettes   Smokeless tobacco: Never  Vaping Use   Vaping Use: Never used  Substance Use Topics   Alcohol use: Yes    Comment: socially   Drug use: Yes    Types: Marijuana    Comment: nightly    Home Medications Prior to Admission medications   Medication Sig Start Date End Date Taking? Authorizing Provider  DULoxetine (CYMBALTA) 60 MG capsule Take 2 capsules (120 mg total) by mouth every morning. 12/15/20  Yes Eulis Canner E, NP  hydrOXYzine (ATARAX/VISTARIL) 10 MG tablet Take 1 tablet (10 mg total) by mouth 3 (three) times daily as needed. Patient taking differently: Take 10 mg by mouth 3 (three) times daily as needed for anxiety. 12/15/20  Yes Salley Slaughter, NP  LO LOESTRIN FE 1 MG-10 MCG /  10 MCG tablet Take 1 tablet by mouth daily. 12/12/20  Yes Shelly Bombard, MD  propranolol (INDERAL) 10 MG tablet Take 1 tablet (10 mg total) by mouth 3 (three) times daily. 12/27/20  Yes Eulis Canner E, NP  QUEtiapine (SEROQUEL) 100 MG tablet TAKE 1 TABLET BY MOUTH EVERYDAY AT BEDTIME Patient taking differently: Take 100 mg by mouth at bedtime. 12/15/20  Yes Salley Slaughter, NP  VYVANSE 50 MG capsule Take 1 capsule (50 mg total) by mouth every morning. 01/05/21  Yes Eulis Canner E, NP  norethindrone-ethinyl estradiol (JUNEL FE 1/20) 1-20 MG-MCG tablet Take 1 tablet by mouth daily. 04/18/20   Constant, Peggy, MD    Allergies    Patient has no known allergies.  Review of Systems   Review of Systems  Neurological:  Positive for headaches.  All other systems reviewed and are negative.  Physical Exam Updated Vital Signs BP 126/90   Pulse 96   Temp 98 F (36.7 C) (Oral)   Resp 18   Ht '5\' 6"'$  (1.676 m)   Wt 72.6  kg   SpO2 98%   BMI 25.82 kg/m   Physical Exam Vitals and nursing note reviewed.  Constitutional:      Appearance: She is well-developed.  HENT:     Head: Normocephalic and atraumatic.  Eyes:     Extraocular Movements: Extraocular movements intact.     Pupils: Pupils are equal, round, and reactive to light.  Cardiovascular:     Rate and Rhythm: Normal rate and regular rhythm.     Heart sounds: No murmur heard. Pulmonary:     Effort: Pulmonary effort is normal. No respiratory distress.     Breath sounds: Normal breath sounds.  Abdominal:     Palpations: Abdomen is soft.     Tenderness: There is no abdominal tenderness. There is no guarding or rebound.  Musculoskeletal:        General: No tenderness.  Skin:    General: Skin is warm and dry.  Neurological:     Mental Status: She is alert and oriented to person, place, and time.     Comments: No asymmetry of facial movement.  5/5 strength in all four extremities with sensation to light touch in all four extremities.    Psychiatric:        Behavior: Behavior normal.    ED Results / Procedures / Treatments   Labs (all labs ordered are listed, but only abnormal results are displayed) Labs Reviewed  COMPREHENSIVE METABOLIC PANEL - Abnormal; Notable for the following components:      Result Value   Calcium 8.7 (*)    All other components within normal limits  RAPID URINE DRUG SCREEN, HOSP PERFORMED - Abnormal; Notable for the following components:   Tetrahydrocannabinol POSITIVE (*)    All other components within normal limits  URINALYSIS, ROUTINE W REFLEX MICROSCOPIC - Abnormal; Notable for the following components:   Color, Urine STRAW (*)    pH 9.0 (*)    Leukocytes,Ua TRACE (*)    Bacteria, UA MANY (*)    All other components within normal limits  CSF CELL COUNT WITH DIFFERENTIAL - Abnormal; Notable for the following components:   RBC Count, CSF 27 (*)    All other components within normal limits  CSF CELL COUNT WITH  DIFFERENTIAL - Abnormal; Notable for the following components:   RBC Count, CSF 1 (*)    All other components within normal limits  RESP PANEL BY RT-PCR (FLU A&B,  COVID) ARPGX2  CSF CULTURE W GRAM STAIN  CULTURE, FUNGUS WITHOUT SMEAR  CULTURE, BLOOD (ROUTINE X 2)  CULTURE, BLOOD (ROUTINE X 2)  ETHANOL  PROTIME-INR  APTT  CBC  DIFFERENTIAL  PROTEIN AND GLUCOSE, CSF  HIV ANTIBODY (ROUTINE TESTING W REFLEX)  HSV 1/2 PCR, CSF  OLIGOCLONAL BANDS, CSF + SERM  DRAW EXTRA CLOT TUBE  CRYPTOCOCCAL ANTIGEN, CSF  VDRL, CSF  LYME DISEASE DNA BY PCR([facility])  RPR  HEMOGLOBIN A1C  LIPID PANEL  ANTITHROMBIN III  PROTEIN C ACTIVITY  PROTEIN C, TOTAL  PROTEIN S ACTIVITY  PROTEIN S, TOTAL  LUPUS ANTICOAGULANT PANEL  BETA-2-GLYCOPROTEIN I ABS, IGG/M/A  HOMOCYSTEINE  FACTOR 5 LEIDEN  PROTHROMBIN GENE MUTATION  CARDIOLIPIN ANTIBODIES, IGG, IGM, IGA  ANCA TITERS  SEDIMENTATION RATE  ANA  I-STAT CHEM 8, ED  I-STAT BETA HCG BLOOD, ED ([facility], [facility], [facility] ONLY)    EKG EKG Interpretation  Date/Time:  Thursday January 12 2021 09:50:12 EDT Ventricular Rate:  90 PR Interval:  117 QRS Duration: 74 QT Interval:  360 QTC Calculation: 441 R Axis:   86 Text Interpretation: Sinus rhythm Borderline short PR interval Confirmed by Quintella Reichert [phone]) on 01/12/2021 10:03:16 AM  Radiology CT Angio Head W or Wo Contrast  Result Date: 01/12/2021 CLINICAL DATA:  Neurological deficit, acute, stroke suspected. Headache over the last 10 days. EXAM: CT ANGIOGRAPHY HEAD AND NECK TECHNIQUE: Multidetector CT imaging of the head and neck was performed using the standard protocol during bolus administration of intravenous contrast. Multiplanar CT image reconstructions and MIPs were obtained to evaluate the vascular anatomy. Carotid stenosis measurements (when applicable) are obtained utilizing NASCET criteria, using the distal internal carotid diameter as the denominator. CONTRAST:  91m OMNIPAQUE IOHEXOL 350 MG/ML  SOLN COMPARISON:  MRI same day.  Head CT 01/09/2021. FINDINGS: CT HEAD Brain: The brain remains normal by CT. No sign of old or acute infarction, mass lesion, hemorrhage, hydrocephalus or extra-axial collection. Punctate acute infarctions shown by MRI would likely be inapparent. Vascular: No abnormal vascular finding. Skull: Normal Sinuses: Clear/normal Other: None CTA NECK FINDINGS Aortic arch: Aortic arch is normal.  Branching pattern is normal. Right carotid system: Common carotid artery widely patent to the bifurcation. Carotid bifurcation is normal. Cervical ICA is normal. No sign of dissection. Left carotid system: Common carotid artery widely patent to the bifurcation. Carotid bifurcation is normal. Cervical ICA is normal. No sign of dissection. Vertebral arteries: Both vertebral artery origins are normal. Both vertebral arteries are normal through the cervical region to the foramen magnum. Skeleton: Normal Other neck: No mass or lymphadenopathy. Upper chest: Normal Review of the MIP images confirms the above findings CTA HEAD FINDINGS Anterior circulation: Both internal carotid arteries are widely patent through the skull base and siphon regions. The anterior and middle cerebral vessels are normal. No large vessel occlusion. No proximal stenosis. No aneurysm or vascular malformation. Posterior circulation: Both vertebral arteries widely patent through the foramen magnum to the basilar. No basilar stenosis. Posterior circulation branch vessels are normal. Venous sinuses: Patent and normal. Anatomic variants: None significant. Review of the MIP images confirms the above findings IMPRESSION: Head CT remains normal. Normal CT angiography of the neck and head. No large vessel occlusion. No evidence of aortic arch pathology. No evidence of dissection in the neck. Electronically Signed   By: MNelson ChimesM.D.   On: 01/12/2021 14:52   CT Angio Neck W and/or Wo Contrast  Result Date: 01/12/2021 CLINICAL DATA:   Neurological deficit,  acute, stroke suspected. Headache over the last 10 days. EXAM: CT ANGIOGRAPHY HEAD AND NECK TECHNIQUE: Multidetector CT imaging of the head and neck was performed using the standard protocol during bolus administration of intravenous contrast. Multiplanar CT image reconstructions and MIPs were obtained to evaluate the vascular anatomy. Carotid stenosis measurements (when applicable) are obtained utilizing NASCET criteria, using the distal internal carotid diameter as the denominator. CONTRAST:  60m OMNIPAQUE IOHEXOL 350 MG/ML SOLN COMPARISON:  MRI same day.  Head CT 01/09/2021. FINDINGS: CT HEAD Brain: The brain remains normal by CT. No sign of old or acute infarction, mass lesion, hemorrhage, hydrocephalus or extra-axial collection. Punctate acute infarctions shown by MRI would likely be inapparent. Vascular: No abnormal vascular finding. Skull: Normal Sinuses: Clear/normal Other: None CTA NECK FINDINGS Aortic arch: Aortic arch is normal.  Branching pattern is normal. Right carotid system: Common carotid artery widely patent to the bifurcation. Carotid bifurcation is normal. Cervical ICA is normal. No sign of dissection. Left carotid system: Common carotid artery widely patent to the bifurcation. Carotid bifurcation is normal. Cervical ICA is normal. No sign of dissection. Vertebral arteries: Both vertebral artery origins are normal. Both vertebral arteries are normal through the cervical region to the foramen magnum. Skeleton: Normal Other neck: No mass or lymphadenopathy. Upper chest: Normal Review of the MIP images confirms the above findings CTA HEAD FINDINGS Anterior circulation: Both internal carotid arteries are widely patent through the skull base and siphon regions. The anterior and middle cerebral vessels are normal. No large vessel occlusion. No proximal stenosis. No aneurysm or vascular malformation. Posterior circulation: Both vertebral arteries widely patent through the foramen  magnum to the basilar. No basilar stenosis. Posterior circulation branch vessels are normal. Venous sinuses: Patent and normal. Anatomic variants: None significant. Review of the MIP images confirms the above findings IMPRESSION: Head CT remains normal. Normal CT angiography of the neck and head. No large vessel occlusion. No evidence of aortic arch pathology. No evidence of dissection in the neck. Electronically Signed   By: MNelson ChimesM.D.   On: 01/12/2021 14:52   MR Brain W and Wo Contrast  Result Date: 01/12/2021 CLINICAL DATA:  Headaches.  Dizziness and blurred vision. EXAM: MRI HEAD WITHOUT AND WITH CONTRAST TECHNIQUE: Multiplanar, multiecho pulse sequences of the brain and surrounding structures were obtained without and with intravenous contrast. CONTRAST:  753mGADAVIST GADOBUTROL 1 MMOL/ML IV SOLN COMPARISON:  CT head January 09, 2021. FINDINGS: Mildly motion limited study. Brain: Punctate focus of restricted diffusion in the right occipital lobe (series 5, image 73; series 6, image 19) with slightt edema (series 11, image 18). Punctate focus of restricted diffusion along the parieto-occipital sulcus on the right (series 5, image 85) with possible slight edema (series 11, image 30). Punctate focus of apparent restricted diffusion in the right parietal cortex (series 5, image 90) with slight associated edema (series 11, image 35). Additional faint punctate focus of DWI hyperintensity in the right frontal cortex (series 5, image 93). Possible additional tiny foci of restricted diffusion along bilateral perirolandic cortex (series 5, image 96) small remote lacunar infarct in the right cerebellum (series 8, image 7) and right caudate (series 8, image 16). Possible tiny foci of restricted diffusion in the right greater than left perirolandic cortex and left frontal cortex (series 5 image 96) may represent additional punctate infarcts or artifact. No acute hemorrhage, mass lesion, midline shift, extra-axial  fluid collection, or hydrocephalus. Vascular: Major arterial flow voids are maintained. Skull and upper cervical spine:  Normal marrow signal. Sinuses/Orbits: Sinuses are largely clear.  Unremarkable orbits. Other: No mastoid effusions. IMPRESSION: 1. Multiple punctate foci of restricted diffusion in the right occipital, right (and possibly left) parietal and frontal cortex, described above and suspicious for acute or subacute punctate infarcts (over artifact) given suggestion of slight edema in some of these areas. Given the potential involvement of multiple vascular territories, consider a central embolic etiology. No significant mass effect. 2. Small remote lacunar infarcts in the right cerebellum and right caudate. Electronically Signed   By: Margaretha Sheffield MD   On: 01/12/2021 12:54   CT VENOGRAM HEAD  Result Date: 01/12/2021 CLINICAL DATA:  Small acute infarctions. Dural venous sinus thrombosis suspected. EXAM: CT VENOGRAM HEAD TECHNIQUE: Venographic phase images of the brain were obtained following the administration of intravenous contrast. Multiplanar reformats and maximum intensity projections were generated. CONTRAST:  39m OMNIPAQUE IOHEXOL 350 MG/ML SOLN COMPARISON:  Arterial studies same day. FINDINGS: Superior sagittal sinus is widely patent. Both transverse and sigmoid sinuses appear normal. Both jugular veins show flow. Deep veins appear normal. No sign of superficial venous thrombosis. IMPRESSION: Normal CT venography. Electronically Signed   By: MNelson ChimesM.D.   On: 01/12/2021 14:54    Procedures .Lumbar Puncture  Date/Time: 01/12/2021 3:49 PM Performed by: RQuintella Reichert MD Authorized by: RQuintella Reichert MD   Consent:    Consent obtained:  Verbal   Consent given by:  Patient   Risks discussed:  Bleeding, infection, headache, nerve damage, pain and repeat procedure Universal protocol:    Patient identity confirmed:  Verbally with patient Pre-procedure details:     Procedure purpose:  Diagnostic Anesthesia:    Anesthesia method:  Local infiltration   Local anesthetic:  Lidocaine 1% w/o epi Procedure details:    Lumbar space:  L4-L5 interspace   Patient position:  R lateral decubitus   Needle gauge:  22   Needle length (in):  3.5   Ultrasound guidance: no     Number of attempts:  1   Opening pressure (cm H2O):  13.5   Fluid appearance:  Clear   Tubes of fluid:  4   Total volume (ml):  5 Post-procedure details:    Puncture site:  Adhesive bandage applied   Procedure completion:  Tolerated well, no immediate complications   Medications Ordered in ED Medications  propranolol (INDERAL) tablet 10 mg (has no administration in time range)  DULoxetine (CYMBALTA) DR capsule 120 mg (has no administration in time range)  hydrOXYzine (ATARAX/VISTARIL) tablet 10 mg (has no administration in time range)  QUEtiapine (SEROQUEL) tablet 100 mg (has no administration in time range)  lisdexamfetamine (VYVANSE) capsule 50 mg (has no administration in time range)   stroke: mapping our early stages of recovery book (has no administration in time range)  0.9 %  sodium chloride infusion (has no administration in time range)  acetaminophen (TYLENOL) tablet 650 mg (has no administration in time range)    Or  acetaminophen (TYLENOL) 160 MG/5ML solution 650 mg (has no administration in time range)    Or  acetaminophen (TYLENOL) suppository 650 mg (has no administration in time range)  LORazepam (ATIVAN) injection 1 mg (1 mg Intravenous Given 01/12/21 1117)  droperidol (INAPSINE) 2.5 MG/ML injection 1.25 mg (1.25 mg Intravenous Given 01/12/21 1035)  gadobutrol (GADAVIST) 1 MMOL/ML injection 7 mL (7 mLs Intravenous Contrast Given 01/12/21 1221)  lidocaine (PF) (XYLOCAINE) 1 % injection 30 mL (30 mLs Infiltration Given 01/12/21 1514)  iohexol (OMNIPAQUE) 350 MG/ML injection 100  mL (75 mLs Intravenous Contrast Given 01/12/21 1422)  LORazepam (ATIVAN) injection 1 mg (1 mg  Intravenous Given 01/12/21 1513)  LORazepam (ATIVAN) injection 1 mg (1 mg Intravenous Given 01/12/21 1749)    ED Course  I have reviewed the triage vital signs and the nursing notes.  Pertinent labs & imaging results that were available during my care of the patient were reviewed by me and considered in my medical decision making (see chart for details).    MDM Rules/Calculators/A&P                          patient here for evaluation of headache, intermittent blurred vision and gait difficulties. She is non-toxic appearing on evaluation with no focal neurologic deficits. MRI obtained, which is concerning for acute CVA. Discussed with neurologist - recommends LP and CT scan and further workup. LP performed per note. Plan to admit to [facility] for further stroke workup. Discussed with patient findings of studies and recommendation for mission and she is in agreement with plan. Hospitalist consulted for admission.  Final Clinical Impression(s) / ED Diagnoses Final diagnoses:  Cerebrovascular accident (CVA), unspecified mechanism [facility])    Rx / [place] Orders ED Discharge Orders     None        Quintella Reichert, MD 01/12/21 [phone]

## 2021-01-12 NOTE — H&P (Signed)
History and Physical    Mary Kane [id number] DOB: May 07, 1991 DOA: 01/12/2021  PCP: Patient, No Pcp Per (Inactive)  Patient coming from: Home.  Chief Complaint: Headache and blurry vision.  HPI: Mary Kane is a 30 y.o. female with history of ADD, mood disorder including anxiety and depression, tobacco abuse and patient is also on oral contraceptives containing estrogen presents to the ER because of headache.  Patient has been having headache for a week and a half.  Headache is mostly on the right side.  Associated with a headache is vision changes with flashing lights.  At times feels dizzy.  2 weeks ago patient's Xanax was discontinued and patient was placed on hydroxyzine and propanolol by patient's psychiatrist.  Has generalized weakness but denies any focal deficits in the extremities.  Denies any difficulty swallowing or speaking.  Patient had gone to her neurologist today and was planned to have MRI as outpatient.  Patient had come to the ER on January 09, 2021 with complaints of headache CT head at that time was unremarkable.  ED Course: In the ER patient had MRI brain with and without contrast which shows multiple punctate foci of reduced diffusion of the right occipital, parietal and frontal cortex suspicious for acute or subacute lacunar infarct.  On-call neurologist was consulted.  On-call neurologist recommended getting a lumbar puncture and also CT venogram and CT angiogram of the head and neck.  CT venogram was unremarkable.  CT angiogram of the head and neck did not show any large vessel obstruction.  Patient passed stroke swallow.  Blood work was unremarkable.  COVID test was negative.  EKG showed normal sinus rhythm.  I discussed with on-call neurology Dr. Cheral Marker at this time plan is to admit patient for stroke work-up.  Review of Systems: As per HPI, rest all negative.   Past Medical History:  Diagnosis Date   ADD (attention deficit disorder)    Anxiety    Depression     Dermoid cyst    History of iron deficiency anemia    Insomnia    Mental disorder    Wears contact lenses    Wears glasses     Past Surgical History:  Procedure Laterality Date   LAPAROSCOPIC OVARIAN CYSTECTOMY Right 12/29/2019   Procedure: Laparoscopic right oophorectomy;  Surgeon: Mora Bellman, MD;  Location: [facility];  Service: Gynecology;  Laterality: Right;   RHINOPLASTY     WISDOM TOOTH EXTRACTION       reports that she has been smoking cigarettes. She has a 0.80 pack-year smoking history. She has never used smokeless tobacco. She reports current alcohol use. She reports current drug use. Drug: Marijuana.  No Known Allergies  Family History  Problem Relation Age of Onset   Chromosomal disorder Father    COPD Father    Headache Neg Hx    Migraines Neg Hx     Prior to Admission medications   Medication Sig Start Date End Date Taking? Authorizing Provider  DULoxetine (CYMBALTA) 60 MG capsule Take 2 capsules (120 mg total) by mouth every morning. 12/15/20  Yes Eulis Canner E, NP  hydrOXYzine (ATARAX/VISTARIL) 10 MG tablet Take 1 tablet (10 mg total) by mouth 3 (three) times daily as needed. Patient taking differently: Take 10 mg by mouth 3 (three) times daily as needed for anxiety. 12/15/20  Yes Parsons, Brittney E, NP  LO LOESTRIN FE 1 MG-10 MCG / 10 MCG tablet Take 1 tablet by mouth daily. 12/12/20  Yes Jodi Mourning,  Clenton Pare, MD  propranolol (INDERAL) 10 MG tablet Take 1 tablet (10 mg total) by mouth 3 (three) times daily. 12/27/20  Yes Eulis Canner E, NP  QUEtiapine (SEROQUEL) 100 MG tablet TAKE 1 TABLET BY MOUTH EVERYDAY AT BEDTIME Patient taking differently: Take 100 mg by mouth at bedtime. 12/15/20  Yes Salley Slaughter, NP  VYVANSE 50 MG capsule Take 1 capsule (50 mg total) by mouth every morning. 01/05/21  Yes Eulis Canner E, NP  norethindrone-ethinyl estradiol (JUNEL FE 1/20) 1-20 MG-MCG tablet Take 1 tablet by mouth daily. 04/18/20    Constant, Peggy, MD    Physical Exam: Constitutional: Moderately built and nourished. Vitals:   01/12/21 1330 01/12/21 1400 01/12/21 1401 01/12/21 1415  BP: 121/86 119/87 119/87 126/90  Pulse: 97 84 87 96  Resp:   18   Temp:      TempSrc:      SpO2: 99% 99% 98% 98%  Weight:      Height:       Eyes: Anicteric no pallor. ENMT: No discharge from the ears eyes nose and mouth. Neck: No mass felt.  No neck rigidity. Respiratory: No rhonchi or crepitations. Cardiovascular: S1-S2 heard. Abdomen: Soft nontender bowel sound present. Musculoskeletal: No edema. Skin: No rash. Neurologic: Alert awake oriented to time place and person.  Moving all extremities 5 x 5.  No facial asymmetry tongue is midline pupils are equal and reacting to light. Psychiatric: Appears normal.  Normal affect.   Labs on Admission: I have personally reviewed following labs and imaging studies  CBC: Recent Labs  Lab 01/12/21 1046 01/12/21 1348  WBC  --  8.6  NEUTROABS  --  4.5  HGB 14.6 12.7  HCT 43.0 40.2  MCV  --  88.9  PLT  --  99991111   Basic Metabolic Panel: Recent Labs  Lab 01/12/21 1046 01/12/21 1348  NA 137 138  K 4.1 4.2  CL 103 105  CO2  --  24  GLUCOSE 92 93  BUN 6 7  CREATININE 0.80 0.65  CALCIUM  --  8.7*   GFR: Estimated Creatinine Clearance: 103 mL/min (by C-G formula based on SCr of 0.65 mg/dL). Liver Function Tests: Recent Labs  Lab 01/12/21 1348  AST 19  ALT 16  ALKPHOS 43  BILITOT 0.5  PROT 6.6  ALBUMIN 3.9   No results for input(s): LIPASE, AMYLASE in the last 168 hours. No results for input(s): AMMONIA in the last 168 hours. Coagulation Profile: Recent Labs  Lab 01/12/21 1348  INR 0.9   Cardiac Enzymes: No results for input(s): CKTOTAL, CKMB, CKMBINDEX, TROPONINI in the last 168 hours. BNP (last 3 results) No results for input(s): PROBNP in the last 8760 hours. HbA1C: No results for input(s): HGBA1C in the last 72 hours. CBG: No results for input(s):  GLUCAP in the last 168 hours. Lipid Profile: No results for input(s): CHOL, HDL, LDLCALC, TRIG, CHOLHDL, LDLDIRECT in the last 72 hours. Thyroid Function Tests: No results for input(s): TSH, T4TOTAL, FREET4, T3FREE, THYROIDAB in the last 72 hours. Anemia Panel: No results for input(s): VITAMINB12, FOLATE, FERRITIN, TIBC, IRON, RETICCTPCT in the last 72 hours. Urine analysis:    Component Value Date/Time   COLORURINE STRAW (A) 01/12/2021 1407   APPEARANCEUR CLEAR 01/12/2021 1407   LABSPEC 1.009 01/12/2021 1407   PHURINE 9.0 (H) 01/12/2021 1407   GLUCOSEU NEGATIVE 01/12/2021 1407   HGBUR NEGATIVE 01/12/2021 1407   [place] 01/12/2021 1407   [place] 01/12/2021 1407  PROTEINUR NEGATIVE 01/12/2021 1407   NITRITE NEGATIVE 01/12/2021 1407   LEUKOCYTESUR TRACE (A) 01/12/2021 1407   Sepsis Labs: '@LABRCNTIP'$ (procalcitonin:4,lacticidven:4) ) Recent Results (from the past 240 hour(s))  Resp Panel by RT-PCR (Flu A&B, Covid) Nasopharyngeal Swab     Status: None   Collection Time: 01/12/21  1:30 PM   Specimen: Nasopharyngeal Swab; Nasopharyngeal(NP) swabs in vial transport medium  Result Value Ref Range Status   SARS Coronavirus 2 by RT PCR NEGATIVE NEGATIVE Final    Comment: (NOTE) SARS-CoV-2 target nucleic acids are NOT DETECTED.  The SARS-CoV-2 RNA is generally detectable in upper respiratory specimens during the acute phase of infection. The lowest concentration of SARS-CoV-2 viral copies this assay can detect is 138 copies/mL. A negative result does not preclude SARS-Cov-2 infection and should not be used as the sole basis for treatment or other patient management decisions. A negative result may occur with  improper specimen collection/handling, submission of specimen other than nasopharyngeal swab, presence of viral mutation(s) within the areas targeted by this assay, and inadequate number of viral copies(<138 copies/mL). A negative result must be combined  with clinical observations, patient history, and epidemiological information. The expected result is Negative.  Fact Sheet for Patients:  EntrepreneurPulse.com.au  Fact Sheet for Healthcare Providers:  IncredibleEmployment.be  This test is no t yet approved or cleared by the [place] FDA and  has been authorized for detection and/or diagnosis of SARS-CoV-2 by FDA under an Emergency Use Authorization (EUA). This EUA will remain  in effect (meaning this test can be used) for the duration of the COVID-19 declaration under Section 564(b)(1) of the Act, 21 U.S.C.section 360bbb-3(b)(1), unless the authorization is terminated  or revoked sooner.       Influenza A by PCR NEGATIVE NEGATIVE Final   Influenza B by PCR NEGATIVE NEGATIVE Final    Comment: (NOTE) The Xpert Xpress SARS-CoV-2/FLU/RSV plus assay is intended as an aid in the diagnosis of influenza from Nasopharyngeal swab specimens and should not be used as a sole basis for treatment. Nasal washings and aspirates are unacceptable for Xpert Xpress SARS-CoV-2/FLU/RSV testing.  Fact Sheet for Patients: EntrepreneurPulse.com.au  Fact Sheet for Healthcare Providers: IncredibleEmployment.be  This test is not yet approved or cleared by the [place] FDA and has been authorized for detection and/or diagnosis of SARS-CoV-2 by FDA under an Emergency Use Authorization (EUA). This EUA will remain in effect (meaning this test can be used) for the duration of the COVID-19 declaration under Section 564(b)(1) of the Act, 21 U.S.C. section 360bbb-3(b)(1), unless the authorization is terminated or revoked.  Performed at [facility], [place] [address], [place], [place] [address]      Radiological Exams on Admission: CT Angio Head W or Wo Contrast  Result Date: 01/12/2021 CLINICAL DATA:  Neurological deficit, acute, stroke suspected. Headache  over the last 10 days. EXAM: CT ANGIOGRAPHY HEAD AND NECK TECHNIQUE: Multidetector CT imaging of the head and neck was performed using the standard protocol during bolus administration of intravenous contrast. Multiplanar CT image reconstructions and MIPs were obtained to evaluate the vascular anatomy. Carotid stenosis measurements (when applicable) are obtained utilizing NASCET criteria, using the distal internal carotid diameter as the denominator. CONTRAST:  10m OMNIPAQUE IOHEXOL 350 MG/ML SOLN COMPARISON:  MRI same day.  Head CT 01/09/2021. FINDINGS: CT HEAD Brain: The brain remains normal by CT. No sign of old or acute infarction, mass lesion, hemorrhage, hydrocephalus or extra-axial collection. Punctate acute infarctions shown by MRI would likely be inapparent.  Vascular: No abnormal vascular finding. Skull: Normal Sinuses: Clear/normal Other: None CTA NECK FINDINGS Aortic arch: Aortic arch is normal.  Branching pattern is normal. Right carotid system: Common carotid artery widely patent to the bifurcation. Carotid bifurcation is normal. Cervical ICA is normal. No sign of dissection. Left carotid system: Common carotid artery widely patent to the bifurcation. Carotid bifurcation is normal. Cervical ICA is normal. No sign of dissection. Vertebral arteries: Both vertebral artery origins are normal. Both vertebral arteries are normal through the cervical region to the foramen magnum. Skeleton: Normal Other neck: No mass or lymphadenopathy. Upper chest: Normal Review of the MIP images confirms the above findings CTA HEAD FINDINGS Anterior circulation: Both internal carotid arteries are widely patent through the skull base and siphon regions. The anterior and middle cerebral vessels are normal. No large vessel occlusion. No proximal stenosis. No aneurysm or vascular malformation. Posterior circulation: Both vertebral arteries widely patent through the foramen magnum to the basilar. No basilar stenosis. Posterior  circulation branch vessels are normal. Venous sinuses: Patent and normal. Anatomic variants: None significant. Review of the MIP images confirms the above findings IMPRESSION: Head CT remains normal. Normal CT angiography of the neck and head. No large vessel occlusion. No evidence of aortic arch pathology. No evidence of dissection in the neck. Electronically Signed   By: Nelson Chimes M.D.   On: 01/12/2021 14:52   CT Angio Neck W and/or Wo Contrast  Result Date: 01/12/2021 CLINICAL DATA:  Neurological deficit, acute, stroke suspected. Headache over the last 10 days. EXAM: CT ANGIOGRAPHY HEAD AND NECK TECHNIQUE: Multidetector CT imaging of the head and neck was performed using the standard protocol during bolus administration of intravenous contrast. Multiplanar CT image reconstructions and MIPs were obtained to evaluate the vascular anatomy. Carotid stenosis measurements (when applicable) are obtained utilizing NASCET criteria, using the distal internal carotid diameter as the denominator. CONTRAST:  60m OMNIPAQUE IOHEXOL 350 MG/ML SOLN COMPARISON:  MRI same day.  Head CT 01/09/2021. FINDINGS: CT HEAD Brain: The brain remains normal by CT. No sign of old or acute infarction, mass lesion, hemorrhage, hydrocephalus or extra-axial collection. Punctate acute infarctions shown by MRI would likely be inapparent. Vascular: No abnormal vascular finding. Skull: Normal Sinuses: Clear/normal Other: None CTA NECK FINDINGS Aortic arch: Aortic arch is normal.  Branching pattern is normal. Right carotid system: Common carotid artery widely patent to the bifurcation. Carotid bifurcation is normal. Cervical ICA is normal. No sign of dissection. Left carotid system: Common carotid artery widely patent to the bifurcation. Carotid bifurcation is normal. Cervical ICA is normal. No sign of dissection. Vertebral arteries: Both vertebral artery origins are normal. Both vertebral arteries are normal through the cervical region to the  foramen magnum. Skeleton: Normal Other neck: No mass or lymphadenopathy. Upper chest: Normal Review of the MIP images confirms the above findings CTA HEAD FINDINGS Anterior circulation: Both internal carotid arteries are widely patent through the skull base and siphon regions. The anterior and middle cerebral vessels are normal. No large vessel occlusion. No proximal stenosis. No aneurysm or vascular malformation. Posterior circulation: Both vertebral arteries widely patent through the foramen magnum to the basilar. No basilar stenosis. Posterior circulation branch vessels are normal. Venous sinuses: Patent and normal. Anatomic variants: None significant. Review of the MIP images confirms the above findings IMPRESSION: Head CT remains normal. Normal CT angiography of the neck and head. No large vessel occlusion. No evidence of aortic arch pathology. No evidence of dissection in the neck. Electronically Signed  By: Nelson Chimes M.D.   On: 01/12/2021 14:52   MR Brain W and Wo Contrast  Result Date: 01/12/2021 CLINICAL DATA:  Headaches.  Dizziness and blurred vision. EXAM: MRI HEAD WITHOUT AND WITH CONTRAST TECHNIQUE: Multiplanar, multiecho pulse sequences of the brain and surrounding structures were obtained without and with intravenous contrast. CONTRAST:  28m GADAVIST GADOBUTROL 1 MMOL/ML IV SOLN COMPARISON:  CT head January 09, 2021. FINDINGS: Mildly motion limited study. Brain: Punctate focus of restricted diffusion in the right occipital lobe (series 5, image 73; series 6, image 19) with slightt edema (series 11, image 18). Punctate focus of restricted diffusion along the parieto-occipital sulcus on the right (series 5, image 85) with possible slight edema (series 11, image 30). Punctate focus of apparent restricted diffusion in the right parietal cortex (series 5, image 90) with slight associated edema (series 11, image 35). Additional faint punctate focus of DWI hyperintensity in the right frontal cortex  (series 5, image 93). Possible additional tiny foci of restricted diffusion along bilateral perirolandic cortex (series 5, image 96) small remote lacunar infarct in the right cerebellum (series 8, image 7) and right caudate (series 8, image 16). Possible tiny foci of restricted diffusion in the right greater than left perirolandic cortex and left frontal cortex (series 5 image 96) may represent additional punctate infarcts or artifact. No acute hemorrhage, mass lesion, midline shift, extra-axial fluid collection, or hydrocephalus. Vascular: Major arterial flow voids are maintained. Skull and upper cervical spine: Normal marrow signal. Sinuses/Orbits: Sinuses are largely clear.  Unremarkable orbits. Other: No mastoid effusions. IMPRESSION: 1. Multiple punctate foci of restricted diffusion in the right occipital, right (and possibly left) parietal and frontal cortex, described above and suspicious for acute or subacute punctate infarcts (over artifact) given suggestion of slight edema in some of these areas. Given the potential involvement of multiple vascular territories, consider a central embolic etiology. No significant mass effect. 2. Small remote lacunar infarcts in the right cerebellum and right caudate. Electronically Signed   By: FMargaretha SheffieldMD   On: 01/12/2021 12:54   CT VENOGRAM HEAD  Result Date: 01/12/2021 CLINICAL DATA:  Small acute infarctions. Dural venous sinus thrombosis suspected. EXAM: CT VENOGRAM HEAD TECHNIQUE: Venographic phase images of the brain were obtained following the administration of intravenous contrast. Multiplanar reformats and maximum intensity projections were generated. CONTRAST:  78mOMNIPAQUE IOHEXOL 350 MG/ML SOLN COMPARISON:  Arterial studies same day. FINDINGS: Superior sagittal sinus is widely patent. Both transverse and sigmoid sinuses appear normal. Both jugular veins show flow. Deep veins appear normal. No sign of superficial venous thrombosis. IMPRESSION:  Normal CT venography. Electronically Signed   By: MaNelson Chimes.D.   On: 01/12/2021 14:54    EKG: Independently reviewed.  Normal sinus rhythm.  Assessment/Plan Principal Problem:   Acute CVA (cerebrovascular accident) (HCEnterpriseActive Problems:   Abnormal brain MRI    Acute CVA -discussed with Dr. LiCheral Markern-call neurologist.  Dr. LiCheral Markern addition advised to get hypercoagulable work-up and sed rate ANA.  We will also be checking 2D echo.  And keep patient on neurochecks.  Check hemoglobin A1c lipid panel.  Aspirin.  Patient lumbar puncture studies are still pending.  Discontinue oral contraceptives which are continuing estrogen.  Discussed about quitting smoking. History of depression and anxiety on Cymbalta, hydroxyzine as needed and propranolol.  Take Seroquel at bedtime. History of ADD on Vyvanse.  Dr. LiCheral Markereurologist advised to hold Vyvanse.   DVT prophylaxis: SCDs.  Patient just had lumbar puncture  swallowing anticoagulation.  Probably can start tomorrow morning. Code Status: Full code. Family Communication: Discussed with patient. Disposition Plan: Home. Consults called: Neurologist. Admission status: Observation.   Rise Patience MD [facility] Pager [phone].  If 7PM-7AM, please contact night-coverage [email or web address] Password [email or web address]  01/12/2021, 5:14 PM

## 2021-01-12 NOTE — Patient Instructions (Addendum)
It was nice to meet you today.  As discussed, I do not believe the new headaches you have had in the past 1-1/2 weeks or migraines.  Headaches can be the result of fluctuating blood pressure values and from stress and dehydration.  You could have side effects from new medications as you started 2 new medications recently.    Your neurological exam is normal thankfully.  You also had a recent CT scan of the head which did not show any acute findings.  Nevertheless, to rule out a structural cause of your headaches I would like to proceed with a brain MRI with and without contrast.  I think it is important that you establish care with a new primary care physician and get a physical done and have them monitor your blood pressure and routine blood work.  We will do some blood work today and call you with the results.  We will check thyroid function, chemistry panel, inflammatory and autoimmune markers as well as vitamin D and B12 levels.  You can continue to use Tylenol as needed for now.  Please also schedule a eye examination as you are due for an eye exam.  Please remember, common headache triggers are: sleep deprivation, dehydration, overheating, stress, hypoglycemia or skipping meals and blood sugar fluctuations, excessive pain medications or excessive alcohol use or caffeine withdrawal. Some people have food triggers such as aged cheese, orange juice or chocolate, especially dark chocolate, or MSG (monosodium glutamate). Try to avoid these headache triggers as much possible. It may be helpful to keep a headache diary to figure out what makes your headaches worse or brings them on and what alleviates them. Some people report headache onset after exercise but studies have shown that regular exercise may actually prevent headaches from coming. If you have exercise-induced headaches, please make sure that you drink plenty of fluid before and after exercising and that you do not over do it and do not  overheat.  For now, we will call you with your MRI results and plan a follow-up afterwards, as needed.  Should you have any sudden onset of one-sided weakness, numbness, tingling, shortness of breath, chest pain, severe worsening of your headache or change in alertness, changes in mental state, please have someone take you to the ER or call 911 immediately.

## 2021-01-12 NOTE — ED Notes (Signed)
Hospitalist at bedside 

## 2021-01-12 NOTE — Plan of Care (Signed)
Asked to comment on MRI findings and further work-up needed for this patient by Dr. Ralene Bathe.  Patient was just seen by Dr. Rexene Alberts today and then presented to the ED for MRI given she is planning to go on a trip to [place] for 2 weeks in 2 days.  MRI surprisingly revealed punctate diffusion restricted lesions, at least 1 with FLAIR correlate mild edema, but no abnormal contrast-enhancement.  Patient per chart review has had headaches and transient changes in vision for 1.5 weeks. Per radiology read, stroke is considered a potential etiology, however given her young age and history, with risk factors only of smoking and oral contraceptive use, should consider broader work-up for potential infectious/inflammatory etiologies as well.  Recommend: -Admission at [facility] on antiplatelet agents at this time as I am not convinced these are strokes -Blood cultures, HIV, RPR -Lumbar puncture with opening pressure and CSF studies (cell counts tubes 1 and 4, protein, glucose, cryptococcal antigen, HSV, Lyme disease, oligoclonal bands, VDRL, gram stain, culture, fungal culture) -Have reached out to Dr. Cheral Marker as well, given Dr. Rexene Alberts saw this patient just earlier today it is reasonable for her to be followed by neurology inpatient starting tomorrow  Lesleigh Noe MD-PhD [facility] [phone]

## 2021-01-12 NOTE — ED Notes (Addendum)
This RN entered room to draw blood cultures, pt stated she did not understand "why I have to stay and she wanted to be with my cat". This RN attempted to explain to patient that her MRI is showing infarcts, there is concern for stroke or other very concerning issues, and the doctors are trying to figure out what is going on. Pt stated "I still don't understand". This RN asked if she had asked the hospitalist any questions when he was in the room 5 minutes prior, and she said no. I asked if pt wanted to speak w the doctor again, she said "I guess that's my only option". Pt asked for something to drink, this RN said I would bring her something in just a moment. This RN messaged hospitalist and asked if he could come see patient again, he asked if he could call her in the room. I relayed this message to the patient. The phone rang this RN handed the patient the phone and the patient looked very scared, tearful and said "I don't know what's going on". This RN said the provider is on the phone to answer any questions or explain things to you. Pt began crying, pt stood up, called this RN a "awful, negative person". Pt stormed out of the room, attempted to ask pt to stay in bed and that she should stay on her back since she had a lumbar puncture. 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 Attempted to ask pt to not curse, to please return to bed, and pt stated "well you are acting like one and you're causing me a panic attack, get out of my room!".  Pt continued to speak over this RN and would not listen to this RN.Hospitalist and ED MD aware. Will continue to monitor

## 2021-01-12 NOTE — Progress Notes (Signed)
Subjective:    Patient ID: Dawnyell Murnane is a 30 y.o. female.  HPI    Star Age, MD, PhD [facility] [address], [address] [place], [place] [address]  I saw patient, Lache Saman, as a referral from the emergency room for new onset headaches.  The patient is unaccompanied today. Ms. Gaut is a 30 year old right-handed woman with an underlying medical history of ADD, mood disorder including anxiety, depression (followed by behavioral health), and smoking, who reports an approximately 1-1/2-week history of persistent headaches.  The headache feels like a bandlike sensation to the right of the sagittal area.  It is constant, not throbbing.  She may have had migraines in the past but this feels different.  She has no associated nausea or vomiting or photophobia or sonophobia.  She has had intermittent blurry vision.  Her last eye examination was over a year ago, she sees Dr. Idolina Primer.  She reports that she is overdue for her eye exam.  She tries to hydrate well with water, estimates that she drinks at least 2 large bottles of water per day, caffeine in the form of coffee, 2 to 3 cups/day.  The caffeine seems to help the headache.  Of note, she was recently switched from Xanax to propranolol and hydroxyzine.  The propranolol is 10 mg 3 times daily and hydroxyzine as 10 mg 3 times daily as needed.  She has not been taking the hydroxyzine regularly but has been taking the propranolol.  The medication change was about 2 weeks ago.  Sometimes she feels dizzy and lightheaded.  She has not been monitoring her blood pressure.  It is much lower than what it was in the emergency room a few days ago but was also high at the time of her psychiatrist follow-up.  She does not currently have a PCP.  She does not drink alcohol.  She smokes about 6 or 7 cigarettes/day.  She is currently not working on smoking cessation.  She does not have a family history of migraines or brain aneurysm  but is worried about an aneurysm.  She recently talked to a coworker who reported that her symptoms could be from an aneurysm.  She denies any one-sided weakness or numbness or tingling or droopy face or slurring of speech.  She presented to the emergency on 01/09/2021 with a 1 week history of new headaches.  She reported a pain across the top of her head, more so on the right side.  She had tenderness on the head as well.  She denied any strokelike symptoms.  She did report some blurry vision.  Exam was benign.  She did not receive any treatments in the emergency room.  She had a head CT without contrast on 01/09/2021 and I reviewed the results: Impression: Unremarkable noncontrast CT appearance of the brain. No evidence of acute intracranial abnormality.  Her blood pressure was elevated in the emergency room.  Her Past Medical History Is Significant For: Past Medical History:  Diagnosis Date   ADD (attention deficit disorder)    Anxiety    Depression    Dermoid cyst    History of iron deficiency anemia    Insomnia    Mental disorder    Wears contact lenses    Wears glasses     Her Past Surgical History Is Significant For: Past Surgical History:  Procedure Laterality Date   LAPAROSCOPIC OVARIAN CYSTECTOMY Right 12/29/2019   Procedure: Laparoscopic right oophorectomy;  Surgeon: Mora Bellman,  MD;  Location: [facility];  Service: Gynecology;  Laterality: Right;   RHINOPLASTY     WISDOM TOOTH EXTRACTION      Her Family History Is Significant For: Family History  Problem Relation Age of Onset   Chromosomal disorder Father    COPD Father    Headache Neg Hx    Migraines Neg Hx     Her Social History Is Significant For: Social History   Socioeconomic History   Marital status: Single    Spouse name: Not on file   Number of children: Not on file   Years of education: Not on file   Highest education level: Not on file  Occupational History   Not on file  Tobacco  Use   Smoking status: Every Day    Packs/day: 0.40    Years: 2.00    Pack years: 0.80    Types: Cigarettes   Smokeless tobacco: Never  Vaping Use   Vaping Use: Never used  Substance and Sexual Activity   Alcohol use: Yes    Comment: socially   Drug use: Yes    Types: Marijuana    Comment: nightly   Sexual activity: Not Currently    Birth control/protection: Pill  Other Topics Concern   Not on file  Social History Narrative   Lives with family   Right handed   Caffeine: 2-3 cups/day   Social Determinants of Health   Financial Resource Strain: Low Risk    Difficulty of Paying Living Expenses: Not very hard  Food Insecurity: No Food Insecurity   Worried About Charity fundraiser in the Last Year: Never true   Ran Out of Food in the Last Year: Never true  Transportation Needs: No Transportation Needs   Lack of Transportation (Medical): No   Lack of Transportation (Non-Medical): No  Physical Activity: Inactive   Days of Exercise per Week: 0 days   Minutes of Exercise per Session: 0 min  Stress: No Stress Concern Present   Feeling of Stress : Only a little  Social Connections: Socially Isolated   Frequency of Communication with Friends and Family: Twice a week   Frequency of Social Gatherings with Friends and Family: Never   Attends Religious Services: Never   Marine scientist or Organizations: No   Attends Music therapist: Never   Marital Status: Never married    Her Allergies Are:  No Known Allergies:   Her Current Medications Are:  Outpatient Encounter Medications as of 01/12/2021  Medication Sig   DULoxetine (CYMBALTA) 60 MG capsule Take 2 capsules (120 mg total) by mouth every morning.   hydrOXYzine (ATARAX/VISTARIL) 10 MG tablet Take 1 tablet (10 mg total) by mouth 3 (three) times daily as needed.   propranolol (INDERAL) 10 MG tablet Take 1 tablet (10 mg total) by mouth 3 (three) times daily.   QUEtiapine (SEROQUEL) 100 MG tablet TAKE 1  TABLET BY MOUTH EVERYDAY AT BEDTIME   VYVANSE 50 MG capsule Take 1 capsule (50 mg total) by mouth every morning.   LO LOESTRIN FE 1 MG-10 MCG / 10 MCG tablet Take 1 tablet by mouth daily.   norethindrone-ethinyl estradiol (JUNEL FE 1/20) 1-20 MG-MCG tablet Take 1 tablet by mouth daily.   No facility-administered encounter medications on file as of 01/12/2021.  :   Review of Systems:  Out of a complete 14 point review of systems, all are reviewed and negative with the exception of these symptoms as listed below:  Review of Systems  Neurological:        Patient is here for evaluation of a "strange, specific headache" which landed her in the ER about three days ago. Patient states the headache started about 1.5 weeks ago, pain localized to a certain portion of her head on the right side and also has had problems with dizziness and lightheadedness. It has not improved and has actually gotten worse. She has tried Ibuprofen and Acetaminophen but they might only help a little.   Objective:  Neurological Exam  Physical Exam Physical Examination:   Vitals:   01/12/21 0727  BP: 115/81  Pulse: 91    General Examination: The patient is a very pleasant 30 y.o. female in no acute distress. She appears well-developed and well-nourished and well groomed.  She is anxious appearing.  Visual acuity on the right 20/40, left 20/30, bilateral 20/30 (without eyeglasses in place).  HEENT: Normocephalic, atraumatic, pupils are equal, round and reactive to light and accommodation. Funduscopic exam is normal with sharp disc margins noted. Extraocular tracking is good without limitation to gaze excursion or nystagmus noted. Normal smooth pursuit is noted. Hearing is grossly intact. Face is symmetric with normal facial animation and normal facial sensation. Speech is clear with no dysarthria noted. There is no hypophonia. There is no lip, neck/head, jaw or voice tremor. Neck is supple with full range of passive  and active motion. There are no carotid bruits on auscultation. Oropharynx exam reveals: moderate mouth dryness, good dental hygiene. Tongue protrudes centrally and palate elevates symmetrically.   Chest: Clear to auscultation without wheezing, rhonchi or crackles noted.  Heart: S1+S2+0, regular and normal without murmurs, rubs or gallops noted.   Abdomen: Soft, non-tender and non-distended with normal bowel sounds appreciated on auscultation.  Extremities: There is no pitting edema in the distal lower extremities bilaterally. Pedal pulses are intact.  Skin: Warm and dry without trophic changes noted. There are no varicose veins.  Musculoskeletal: exam reveals no obvious joint deformities, tenderness or joint swelling or erythema.   Neurologically:  Mental status: The patient is awake, alert and oriented in all 4 spheres. Her immediate and remote memory, attention, language skills and fund of knowledge are appropriate. There is no evidence of aphasia, agnosia, apraxia or anomia. Speech is clear with normal prosody and enunciation. Thought process is linear. Mood is anxious and affect is constricted.  Cranial nerves II - XII are as described above under HEENT exam. In addition: shoulder shrug is normal with equal shoulder height noted. Motor exam: Normal bulk, strength and tone is noted. There is no drift, tremor or rebound. Romberg is negative. Reflexes are 2+ throughout. Babinski: Toes are flexor bilaterally. Fine motor skills and coordination: intact with normal finger taps, normal hand movements, normal rapid alternating patting, normal foot taps and normal foot agility.  Cerebellar testing: No dysmetria or intention tremor on finger to nose testing. Heel to shin is unremarkable bilaterally. There is no truncal or gait ataxia.  Sensory exam: intact to light touch, vibration, temperature sense in the upper and lower extremities.  Gait, station and balance: She stands easily. No veering to one  side is noted. No leaning to one side is noted. Posture is age-appropriate and stance is narrow based. Gait shows normal stride length and normal pace. No problems turning are noted. Tandem walk is unremarkable.   Assessment and Plan:   In summary, Tomeka Guay is a very pleasant 30 y.o.-year old female with an underlying medical history of ADD,  mood disorder including anxiety, depression (followed by behavioral health), and smoking, who presents for evaluation of her headaches.  She reports a constant headache that started about a week and a half ago.  She presented to the emergency room and had a head CT without contrast which was without acute findings.  Neurological exam currently is benign and she is largely reassured.  Nevertheless, I had a few suggestions for her today.  I do not believe that her headaches are migrainous in description, headache etiology could be secondary to recent medication changes, dizziness could be secondary to blood pressure fluctuations as she has a low normal blood pressure for the systolic value today but had a rather high blood pressure a few days ago in the ER.  She has been started on propranolol and is advised that it can cause dizziness.  She is encouraged to follow-up with her psychiatrist as scheduled but also establish care with primary care to monitor blood pressure and get a physical as well as routine blood work.  Since she has not had any blood work in the recent past, I suggested we proceed with blood work.  We will check inflammatory and autoimmune markers, TSH, chemistry panel and vitamin D and B12.  We will call her with the results.  To rule out a structural cause of her headaches I would like to proceed with a brain MRI with and without contrast.  She would be agreeable to this as well.  She is advised to use Tylenol as needed for now and monitor her blood pressure and her symptoms.  If she has any sudden onset of exacerbation of her headache, and a new onset  one-sided weakness or numbness or tingling or droopy face or slurring of speech or changes in her mental state, she is advised to have somebody take her to the ER or call 911.  We will call her with her brain MRI results and blood test results and follow-up in this clinic accordingly.  She is advised to schedule her eye examination as she is overdue for an eye exam.  She has had intermittent blurry vision.  We talked about headache triggers.  She is advised to stay well-hydrated, well rested.  We talked about stress being a trigger as well.  She does seem to be anxious at this time.  She has follow-up with her behavioral health provider.  She is advised to work on smoking cessation.  She is furthermore encouraged to talk to her primary care once she is established about ways to help with smoking cessation.  She asked why she did not get the MRI in the emergency room.  I explained to her that the emergency room provider assessed her to the best of their abilities and a CT scan is typically the test of choice for acute evaluation.  We will call her to schedule her MRI once we have insurance authorization.  She is wondering if she could get an MRI quicker if she were to go back to the emergency room.  I explained to her that that would be at the discretion of the emergency room provider as they assess the patient's in the moment and make a medical decision and recommend imaging testing according to their evaluation.  I answered all her questions today and she was in agreement with the plan.  Star Age, MD, PhD

## 2021-01-12 NOTE — ED Notes (Signed)
Pt confused about MRI results and necessity of lumbar puncture and admission, provider made aware and asked to come speak w pt again, provider said she would be in momentarily

## 2021-01-12 NOTE — ED Triage Notes (Signed)
Pt c/o headache x 1.5 weeks, was seen here recently for this and had CT done. Pt was referred to neurology and pt went this am, but pt wants an MRI and neurology could not do this today. Pt c/o dizziness and some blurry vision, denies hx of migraines

## 2021-01-13 ENCOUNTER — Observation Stay (HOSPITAL_BASED_OUTPATIENT_CLINIC_OR_DEPARTMENT_OTHER): Payer: 59

## 2021-01-13 ENCOUNTER — Observation Stay (HOSPITAL_COMMUNITY): Payer: 59

## 2021-01-13 DIAGNOSIS — Z789 Other specified health status: Secondary | ICD-10-CM | POA: Diagnosis not present

## 2021-01-13 DIAGNOSIS — I639 Cerebral infarction, unspecified: Secondary | ICD-10-CM | POA: Diagnosis not present

## 2021-01-13 DIAGNOSIS — F17200 Nicotine dependence, unspecified, uncomplicated: Secondary | ICD-10-CM

## 2021-01-13 DIAGNOSIS — E782 Mixed hyperlipidemia: Secondary | ICD-10-CM | POA: Diagnosis not present

## 2021-01-13 DIAGNOSIS — I6389 Other cerebral infarction: Secondary | ICD-10-CM

## 2021-01-13 DIAGNOSIS — E785 Hyperlipidemia, unspecified: Secondary | ICD-10-CM | POA: Diagnosis present

## 2021-01-13 LAB — HEMOGLOBIN A1C
Hgb A1c MFr Bld: 5.3 % (ref 4.8–5.6)
Mean Plasma Glucose: 105.41 mg/dL

## 2021-01-13 LAB — ENA+DNA/DS+SJORGEN'S
ENA RNP Ab: 0.9 AI (ref 0.0–0.9)
ENA SM Ab Ser-aCnc: <0.2 AI (ref 0.0–0.9)
ENA SSA (RO) Ab: 0.2 AI (ref 0.0–0.9)
ENA SSB (LA) Ab: <0.2 AI (ref 0.0–0.9)
dsDNA Ab: 1 IU/mL (ref 0–9)

## 2021-01-13 LAB — COMPREHENSIVE METABOLIC PANEL
ALT: 12 IU/L (ref 0–32)
AST: 16 IU/L (ref 0–40)
Albumin/Globulin Ratio: 1.6 (ref 1.2–2.2)
Albumin: 4.1 g/dL (ref 3.8–4.8)
Alkaline Phosphatase: 56 IU/L (ref 44–121)
BUN/Creatinine Ratio: 9 (ref 9–23)
BUN: 7 mg/dL (ref 6–20)
Bilirubin Total: <0.2 mg/dL (ref 0.0–1.2)
CO2: 23 mmol/L (ref 20–29)
Calcium: 8.9 mg/dL (ref 8.7–10.2)
Chloride: 103 mmol/L (ref 96–106)
Creatinine, Ser: 0.78 mg/dL (ref 0.57–1.00)
Globulin, Total: 2.6 g/dL (ref 1.5–4.5)
Glucose: 95 mg/dL (ref 65–99)
Potassium: 4.9 mmol/L (ref 3.5–5.2)
Sodium: 139 mmol/L (ref 134–144)
Total Protein: 6.7 g/dL (ref 6.0–8.5)
eGFR: 103 mL/min/{1.73_m2} (ref >59)

## 2021-01-13 LAB — B12 AND FOLATE PANEL
Folate: 5.7 ng/mL (ref >3.0)
Vitamin B-12: 561 pg/mL (ref 232–1245)

## 2021-01-13 LAB — CBC WITH DIFFERENTIAL/PLATELET
Basophils Absolute: 0 10*3/uL (ref 0.0–0.2)
Basos: 0 %
EOS (ABSOLUTE): 0.4 10*3/uL (ref 0.0–0.4)
Eos: 5 %
Hematocrit: 40.6 % (ref 34.0–46.6)
Hemoglobin: 13.1 g/dL (ref 11.1–15.9)
Immature Grans (Abs): 0 10*3/uL (ref 0.0–0.1)
Immature Granulocytes: 0 %
Lymphocytes Absolute: 3.5 10*3/uL — ABNORMAL HIGH (ref 0.7–3.1)
Lymphs: 46 %
MCH: 27.8 pg (ref 26.6–33.0)
MCHC: 32.3 g/dL (ref 31.5–35.7)
MCV: 86 fL (ref 79–97)
Monocytes Absolute: 0.6 10*3/uL (ref 0.1–0.9)
Monocytes: 8 %
Neutrophils Absolute: 3.1 10*3/uL (ref 1.4–7.0)
Neutrophils: 41 %
Platelets: 281 10*3/uL (ref 150–450)
RBC: 4.71 x10E6/uL (ref 3.77–5.28)
RDW: 12.5 % (ref 11.7–15.4)
WBC: 7.6 10*3/uL (ref 3.4–10.8)

## 2021-01-13 LAB — LIPID PANEL
Cholesterol: 223 mg/dL — ABNORMAL HIGH (ref 0–200)
HDL: 35 mg/dL — ABNORMAL LOW (ref >40)
LDL Cholesterol: 164 mg/dL — ABNORMAL HIGH (ref 0–99)
Total CHOL/HDL Ratio: 6.4 RATIO
Triglycerides: 119 mg/dL (ref <150)
VLDL: 24 mg/dL (ref 0–40)

## 2021-01-13 LAB — RPR: RPR Ser Ql: NONREACTIVE

## 2021-01-13 LAB — TSH: TSH: 0.82 u[IU]/mL (ref 0.450–4.500)

## 2021-01-13 LAB — RHEUMATOID FACTOR: Rheumatoid fact SerPl-aCnc: <10 IU/mL (ref <14.0)

## 2021-01-13 LAB — ECHOCARDIOGRAM COMPLETE
Area-P 1/2: 4.39 cm2
Height: 66 in
S' Lateral: 2.5 cm
Weight: 2560 oz

## 2021-01-13 LAB — ANA W/REFLEX: Anti Nuclear Antibody (ANA): POSITIVE — AB

## 2021-01-13 LAB — VITAMIN D 25 HYDROXY (VIT D DEFICIENCY, FRACTURES): Vit D, 25-Hydroxy: 42.9 ng/mL (ref 30.0–100.0)

## 2021-01-13 LAB — SEDIMENTATION RATE: Sed Rate: 17 mm/hr (ref 0–32)

## 2021-01-13 LAB — HGB A1C W/O EAG: Hgb A1c MFr Bld: 5.4 % (ref 4.8–5.6)

## 2021-01-13 MED ORDER — ATORVASTATIN CALCIUM 80 MG PO TABS: 80.0000 mg | ORAL_TABLET | Freq: Every day | ORAL | 0 refills | Status: AC

## 2021-01-13 MED ORDER — ATORVASTATIN CALCIUM 80 MG PO TABS
80.0000 mg | ORAL_TABLET | Freq: Every day | ORAL | Status: DC
  Administered 2021-01-13: 80 mg via ORAL
  Filled 2021-01-13: qty 1

## 2021-01-13 MED ORDER — CLOPIDOGREL BISULFATE 75 MG PO TABS: 75.0000 mg | ORAL_TABLET | Freq: Every day | ORAL | 0 refills | Status: AC

## 2021-01-13 MED ORDER — ASPIRIN 81 MG PO TBEC: 81.0000 mg | DELAYED_RELEASE_TABLET | Freq: Every day | ORAL | 11 refills | Status: AC

## 2021-01-13 MED ORDER — CLOPIDOGREL BISULFATE 75 MG PO TABS
75.0000 mg | ORAL_TABLET | Freq: Once | ORAL | Status: AC
  Administered 2021-01-13: 75 mg via ORAL
  Filled 2021-01-13: qty 1

## 2021-01-13 NOTE — TOC CAGE-AID Note (Signed)
Transition of Care [facility]) - CAGE-AID Screening   Patient Details  Name: Mary Kane MRN: [id number] Date of Birth: [birth year]  Transition of Care [facility]) CM/SW Contact:    Mary Kane, LCSWA Phone Number: 01/13/2021, 2:55 PM   Clinical Narrative: Pt participated in [facility].  Pt denied use of substance and ETOH.  Pt was offered resources.  Pt stated they did not feel that they were in need of resources at this time.   Mary Kane, MSW, LCSW-A Pronouns:  She/Her/Hers [facility] HealthTransitions of Care Clinical Social Worker Direct Number:  [phone] [email or web address]   CAGE-AID Screening:    Have You Ever Felt You Ought to Cut Down on Your Drinking or Drug Use?: No Have People Annoyed You By [employer] Your Drinking Or Drug Use?: No Have You Felt Bad Or Guilty About Your Drinking Or Drug Use?: No Have You Ever Had a Drink or Used Drugs First Thing In The Morning to Steady Your Nerves or to Get Rid of a Hangover?: No CAGE-AID Score: 0  Substance Abuse Education Offered: No

## 2021-01-13 NOTE — Evaluation (Signed)
Occupational Therapy Evaluation Patient Details Name: Mary Kane MRN: [id number] DOB: 1991/06/08 Today's Date: 01/13/2021    History of Present Illness 30 y/o female presented to ED initially on 7/25 for headache x 1 week with sensitivity to light. At the time, CT head negative. Patient returned to ED on 7/28 with continued headache requesting MRI. 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 CT venogram unremarkable. CT angiogram negative for large vessel obstruction. Lumbar puncture normal. PMH: ADD, anxiety, depression, tobacco use   Clinical Impression   Patient admitted for the diagnosis above.  PTA she lives with her family, works full time, and is independent with all aspects of her care.  Patient has no identified residual deficits, and is functioning at her baseline.  Of note she is very emotionally liable regarding her situation, and life in general.  She admits she is depressed, and just wants to leave the hospital.  She has no acute or post acute rehab needs.      Follow Up Recommendations  No OT follow up    Equipment Recommendations  None recommended by OT    Recommendations for Other Services       Precautions / Restrictions Precautions Precautions: None Restrictions Weight Bearing Restrictions: No      Mobility Bed Mobility Overal bed mobility: Independent               Patient Response: Anxious  Transfers Overall transfer level: Independent Equipment used: None                  Balance Overall balance assessment: No apparent balance deficits (not formally assessed)                                         ADL either performed or assessed with clinical judgement   ADL Overall ADL's : Independent                                              Vision Baseline Vision/History: Wears glasses Wears Glasses: At all times Patient Visual Report: No change from baseline       Perception     Praxis      Pertinent Vitals/Pain Pain Assessment: No/denies pain Pain Score: 3  Pain Location: head Pain Descriptors / Indicators: Headache Pain Intervention(s): Monitored during session     Hand Dominance Right   Extremity/Trunk Assessment Upper Extremity Assessment Upper Extremity Assessment: Overall WFL for tasks assessed   Lower Extremity Assessment Lower Extremity Assessment: Overall WFL for tasks assessed   Cervical / Trunk Assessment Cervical / Trunk Assessment: Normal   Communication Communication Communication: No difficulties   Cognition Arousal/Alertness: Awake/alert Behavior During Therapy: Restless Overall Cognitive Status: Within Functional Limits for tasks assessed                                 General Comments: continues to be emotionally liable.   General Comments       Exercises     Shoulder Instructions      Home Living Family/patient expects to  be discharged to:: Private residence Living Arrangements: Parent Available Help at Discharge: Family;Available PRN/intermittently Type of Home: House Home Access: Stairs to enter [employer] of Steps: 2 Entrance Stairs-Rails: Right;Left;Can reach both Home Layout: Two level;Bed/bath upstairs Alternate Level Stairs-Number of Steps: flight Alternate Level Stairs-Rails: Right;Left;Can reach both Bathroom Shower/Tub: Teacher, early years/pre: Standard     Home Equipment: None          Prior Functioning/Environment Level of Independence: Independent        Comments: works full time        OT Problem List: Impaired balance (sitting and/or standing)      OT Treatment/Interventions:      OT Goals(Current goals can be found in the care plan section) Acute Rehab OT Goals Patient Stated  Goal: has a vacation planned OT Goal Formulation: With patient Time For Goal Achievement: 01/13/21 Potential to Achieve Goals: Good  OT Frequency:     Barriers to D/C:  Depression          Co-evaluation              AM-PAC OT "6 Clicks" Daily Activity     Outcome Measure Help from another person eating meals?: None Help from another person taking care of personal grooming?: None Help from another person toileting, which includes using toliet, bedpan, or urinal?: None Help from another person bathing (including washing, rinsing, drying)?: None Help from another person to put on and taking off regular upper body clothing?: None Help from another person to put on and taking off regular lower body clothing?: None 6 Click Score: 24   End of Session    Activity Tolerance: Patient tolerated treatment well Patient left: in bed;with call bell/phone within reach  OT Visit Diagnosis: Hemiplegia and hemiparesis Hemiplegia - caused by: Unspecified                Time: [id number] OT Time Calculation (min): 11 min Charges:  OT General Charges $OT Visit: 1 Visit OT Evaluation $OT Eval Moderate Complexity: 1 Mod  01/13/2021  Rich, OTR/L  Acute Rehabilitation Services  Office:  [phone]   Metta Clines 01/13/2021, 11:08 AM

## 2021-01-13 NOTE — Progress Notes (Signed)
TCD w/ bubble study completed.   Please see CV Proc for preliminary results.   Armas Mcbee, RDMS, RVT  

## 2021-01-13 NOTE — Discharge Summary (Signed)
Physician Discharge Summary  Teran Daughenbaugh KGY:[id number] DOB: July 08, 1990 DOA: 01/12/2021  PCP: Patient, No Pcp Per (Inactive)  Admit date: 01/12/2021 Discharge date: 01/13/2021    Admitted From: Home Disposition: Home  Recommendations for Outpatient Follow-up:  Follow up with PCP in 1-2 weeks Follow-up with neurology in 3 to 4 weeks Please obtain BMP/CBC in one week Please follow up with your PCP on the following pending results: Unresulted Labs (From admission, onward)     Start     Ordered   01/12/21 1714  ANA  Once,   R        01/12/21 1714   01/12/21 1713  Protein C activity  (Hypercoagulable Panel, Comprehensive (PNL))  Once,   R        01/12/21 1714   01/12/21 1713  Protein C, total  (Hypercoagulable Panel, Comprehensive (PNL))  Once,   R        01/12/21 1714   01/12/21 1713  Protein S activity  (Hypercoagulable Panel, Comprehensive (PNL))  Once,   R        01/12/21 1714   01/12/21 1713  Protein S, total  (Hypercoagulable Panel, Comprehensive (PNL))  Once,   R        01/12/21 1714   01/12/21 1713  Lupus anticoagulant panel  (Hypercoagulable Panel, Comprehensive (PNL))  Once,   R        01/12/21 1714   01/12/21 1713  Beta-2-glycoprotein i abs, IgG/M/A  (Hypercoagulable Panel, Comprehensive (PNL))  Once,   R        01/12/21 1714   01/12/21 1713  Homocysteine, serum  (Hypercoagulable Panel, Comprehensive (PNL))  Once,   R        01/12/21 1714   01/12/21 1713  Factor 5 leiden  (Hypercoagulable Panel, Comprehensive (PNL))  Once,   R        01/12/21 1714   01/12/21 1713  Prothrombin gene mutation  (Hypercoagulable Panel, Comprehensive (PNL))  Once,   R        01/12/21 1714   01/12/21 1713  Cardiolipin antibodies, IgG, IgM, IgA  (Hypercoagulable Panel, Comprehensive (PNL))  Once,   R        01/12/21 1714   01/12/21 1713  ANCA Titers  (Anti-Neutrophilic Cystoplasmic Antibody Panel (PNL))  Once,   R        01/12/21 1714   01/12/21 1343  Culture, blood (routine x 2)  (Meningitis  Panel)  BLOOD CULTURE X 2,   R (with STAT occurrences)      01/12/21 1343   01/12/21 1343  VDRL, CSF  (Meningitis Panel)  Once,   R        01/12/21 1343   01/12/21 1343  Lyme disease dna by pcr(borrelia burg)  Once,   R        01/12/21 1343   01/12/21 1342  HSV 1/2 PCR, CSF Cerebrospinal Fluid  (Meningitis Panel)  Once,   R        01/12/21 1343   01/12/21 1342  Oligoclonal bands, CSF + serum  (Meningitis Panel)  Once,   R        01/12/21 1343   01/12/21 1342  Draw extra clot tube  (Meningitis Panel)  Once,   R        01/12/21 1343              Home Health: None Equipment/Devices: None  Discharge Condition: Stable CODE STATUS: Full code Diet recommendation: Cardiac  Subjective: Seen and  examined.  No complaints.  All the symptoms have resolved.  HPI: Mary Kane is a 30 y.o. female with history of ADD, mood disorder including anxiety and depression, tobacco abuse and patient is also on oral contraceptives containing estrogen presents to the ER because of headache.  Patient has been having headache for a week and a half.  Headache is mostly on the right side.  Associated with a headache is vision changes with flashing lights.  At times feels dizzy.  2 weeks ago patient's Xanax was discontinued and patient was placed on hydroxyzine and propanolol by patient's psychiatrist.  Has generalized weakness but denies any focal deficits in the extremities.  Denies any difficulty swallowing or speaking.  Patient had gone to her neurologist today and was planned to have MRI as outpatient.  Patient had come to the ER on January 09, 2021 with complaints of headache CT head at that time was unremarkable.   ED Course: In the ER patient had MRI brain with and without contrast which shows multiple punctate foci of reduced diffusion of the right occipital, parietal and frontal cortex suspicious for acute or subacute lacunar infarct.  On-call neurologist was consulted.  On-call neurologist recommended  getting a lumbar puncture and also CT venogram and CT angiogram of the head and neck.  CT venogram was unremarkable.  CT angiogram of the head and neck did not show any large vessel obstruction.  Patient passed stroke swallow.  Blood work was unremarkable.  COVID test was negative.  EKG showed normal sinus rhythm.  I discussed with on-call neurology Dr. Cheral Marker at this time plan is to admit patient for stroke work-up.   Brief/Interim Summary: Patient was transferred to [facility].  Neurology continue to see her.  She underwent transthoracic echo which did not show any PFO.  Neurology recommended that she stays and gets her TEE on Monday but patient does not want to stay in the hospital for that.  She also told neurology that she has a flight to go to [place] in 4 days for her best friend's wedding and she wants to continue with the flight and she was counseled by neurology and myself that within 14 days after his stroke, irritable can increase the risk of a stroke.  She verbalized understanding and she will think about it.  She also was found to have low HDL and high LDL.  Started on atorvastatin.  She was already started on aspirin.  Hypercoagulable panel to include autoimmune condition such as vasculitis and SLE in addition to genetic studies of prothrombin gene and factor V Leiden already ordered by neurology but not resulted.  Spinal tap showed normal protein and normal cell count.  CT venogram was also normal.  She is cleared from neurology for discharge.  They recommended DAPT for 3 weeks and then aspirin indefinitely.  Due to her childbearing age, she is at risk of being pregnant and atorvastatin/statins are contraindicated during pregnancy.  I had a detailed discussion with the patient that she needs to be on some sort of contraception while she is on atorvastatin and that she needs to stop statins at least 2 months prior to planning to become pregnant or stop immediately if she does become  pregnant.  She verbalized understanding of that.  Hemoglobin A1c normal.  ESR was normal as well.  Discharge Diagnoses:  Principal Problem:   Acute CVA (cerebrovascular accident) [facility]) Active Problems:   Borderline personality disorder ([place])   Abnormal brain MRI  Hyperlipidemia    Discharge Instructions  Discharge Instructions     Ambulatory referral to Neurology   Complete by: As directed    Follow up with Dr. Leonie Man at [facility] in 4 weeks. Too complicated for RN to follow. Thanks.      Allergies as of 01/13/2021   No Known Allergies      Medication List     STOP taking these medications    Lo Loestrin Fe 1 MG-10 MCG / 10 MCG tablet Generic drug: Norethindrone-Ethinyl Estradiol-Fe Biphas       TAKE these medications    aspirin 81 MG EC tablet Take 1 tablet (81 mg total) by mouth daily. Swallow whole. Start taking on: January 14, 2021   atorvastatin 80 MG tablet Commonly known as: LIPITOR Take 1 tablet (80 mg total) by mouth daily. Start taking on: January 14, 2021   clopidogrel 75 MG tablet Commonly known as: PLAVIX Take 1 tablet (75 mg total) by mouth daily for 20 doses. Start taking on: January 14, 2021   DULoxetine 60 MG capsule Commonly known as: CYMBALTA Take 2 capsules (120 mg total) by mouth every morning.   hydrOXYzine 10 MG tablet Commonly known as: ATARAX/VISTARIL Take 1 tablet (10 mg total) by mouth 3 (three) times daily as needed. What changed: reasons to take this   norethindrone-ethinyl estradiol-FE 1-20 MG-MCG tablet Commonly known as: Junel FE 1/20 Take 1 tablet by mouth daily.   propranolol 10 MG tablet Commonly known as: INDERAL Take 1 tablet (10 mg total) by mouth 3 (three) times daily.   QUEtiapine 100 MG tablet Commonly known as: SEROQUEL TAKE 1 TABLET BY MOUTH EVERYDAY AT BEDTIME What changed:  how much to take how to take this when to take this additional instructions   Vyvanse 50 MG capsule Generic drug: lisdexamfetamine Take  1 capsule (50 mg total) by mouth every morning.        Follow-up Information     Garvin Fila, MD. Schedule an appointment as soon as possible for a visit in 1 month(s).   Specialties: Neurology, Radiology Why: stroke clinic Contact information: [address] [place] [address] [phone]                No Known Allergies  Consultations: Neurology   Procedures/Studies: CT Angio Head W or Wo Contrast  Result Date: 01/12/2021 CLINICAL DATA:  Neurological deficit, acute, stroke suspected. Headache over the last 10 days. EXAM: CT ANGIOGRAPHY HEAD AND NECK TECHNIQUE: Multidetector CT imaging of the head and neck was performed using the standard protocol during bolus administration of intravenous contrast. Multiplanar CT image reconstructions and MIPs were obtained to evaluate the vascular anatomy. Carotid stenosis measurements (when applicable) are obtained utilizing NASCET criteria, using the distal internal carotid diameter as the denominator. CONTRAST:  63m OMNIPAQUE IOHEXOL 350 MG/ML SOLN COMPARISON:  MRI same day.  Head CT 01/09/2021. FINDINGS: CT HEAD Brain: The brain remains normal by CT. No sign of old or acute infarction, mass lesion, hemorrhage, hydrocephalus or extra-axial collection. Punctate acute infarctions shown by MRI would likely be inapparent. Vascular: No abnormal vascular finding. Skull: Normal Sinuses: Clear/normal Other: None CTA NECK FINDINGS Aortic arch: Aortic arch is normal.  Branching pattern is normal. Right carotid system: Common carotid artery widely patent to the bifurcation. Carotid bifurcation is normal. Cervical ICA is normal. No sign of dissection. Left carotid system: Common carotid artery widely patent to the bifurcation. Carotid bifurcation is normal. Cervical ICA is normal. No sign  of dissection. Vertebral arteries: Both vertebral artery origins are normal. Both vertebral arteries are normal through the cervical region to the  foramen magnum. Skeleton: Normal Other neck: No mass or lymphadenopathy. Upper chest: Normal Review of the MIP images confirms the above findings CTA HEAD FINDINGS Anterior circulation: Both internal carotid arteries are widely patent through the skull base and siphon regions. The anterior and middle cerebral vessels are normal. No large vessel occlusion. No proximal stenosis. No aneurysm or vascular malformation. Posterior circulation: Both vertebral arteries widely patent through the foramen magnum to the basilar. No basilar stenosis. Posterior circulation branch vessels are normal. Venous sinuses: Patent and normal. Anatomic variants: None significant. Review of the MIP images confirms the above findings IMPRESSION: Head CT remains normal. Normal CT angiography of the neck and head. No large vessel occlusion. No evidence of aortic arch pathology. No evidence of dissection in the neck. Electronically Signed   By: Nelson Chimes M.D.   On: 01/12/2021 14:52   CT Head Wo Contrast  Result Date: 01/09/2021 CLINICAL DATA:  Headache, chronic, new features or increased frequency; R sided head pain, vision change. Additional history provided: Patient reports headache for 1 week, pain behind right eye, sensitivity to light, nausea. EXAM: CT HEAD WITHOUT CONTRAST TECHNIQUE: Contiguous axial images were obtained from the base of the skull through the vertex without intravenous contrast. COMPARISON:  No pertinent prior exams available for comparison. FINDINGS: Brain: Cerebral volume is normal. There is no acute intracranial hemorrhage. No demarcated cortical infarct. No extra-axial fluid collection. No evidence of an intracranial mass. No midline shift. Vascular: No hyperdense vessel. Skull: Normal. Negative for fracture or focal lesion. Sinuses/Orbits: Visualized orbits show no acute finding. No significant paranasal sinus disease at the imaged levels. IMPRESSION: Unremarkable non-contrast CT appearance of the brain. No  evidence of acute intracranial abnormality. Electronically Signed   By: Kellie Simmering DO   On: 01/09/2021 11:45   CT Angio Neck W and/or Wo Contrast  Result Date: 01/12/2021 CLINICAL DATA:  Neurological deficit, acute, stroke suspected. Headache over the last 10 days. EXAM: CT ANGIOGRAPHY HEAD AND NECK TECHNIQUE: Multidetector CT imaging of the head and neck was performed using the standard protocol during bolus administration of intravenous contrast. Multiplanar CT image reconstructions and MIPs were obtained to evaluate the vascular anatomy. Carotid stenosis measurements (when applicable) are obtained utilizing NASCET criteria, using the distal internal carotid diameter as the denominator. CONTRAST:  40m OMNIPAQUE IOHEXOL 350 MG/ML SOLN COMPARISON:  MRI same day.  Head CT 01/09/2021. FINDINGS: CT HEAD Brain: The brain remains normal by CT. No sign of old or acute infarction, mass lesion, hemorrhage, hydrocephalus or extra-axial collection. Punctate acute infarctions shown by MRI would likely be inapparent. Vascular: No abnormal vascular finding. Skull: Normal Sinuses: Clear/normal Other: None CTA NECK FINDINGS Aortic arch: Aortic arch is normal.  Branching pattern is normal. Right carotid system: Common carotid artery widely patent to the bifurcation. Carotid bifurcation is normal. Cervical ICA is normal. No sign of dissection. Left carotid system: Common carotid artery widely patent to the bifurcation. Carotid bifurcation is normal. Cervical ICA is normal. No sign of dissection. Vertebral arteries: Both vertebral artery origins are normal. Both vertebral arteries are normal through the cervical region to the foramen magnum. Skeleton: Normal Other neck: No mass or lymphadenopathy. Upper chest: Normal Review of the MIP images confirms the above findings CTA HEAD FINDINGS Anterior circulation: Both internal carotid arteries are widely patent through the skull base and siphon regions. The anterior and  middle  cerebral vessels are normal. No large vessel occlusion. No proximal stenosis. No aneurysm or vascular malformation. Posterior circulation: Both vertebral arteries widely patent through the foramen magnum to the basilar. No basilar stenosis. Posterior circulation branch vessels are normal. Venous sinuses: Patent and normal. Anatomic variants: None significant. Review of the MIP images confirms the above findings IMPRESSION: Head CT remains normal. Normal CT angiography of the neck and head. No large vessel occlusion. No evidence of aortic arch pathology. No evidence of dissection in the neck. Electronically Signed   By: Nelson Chimes M.D.   On: 01/12/2021 14:52   MR Brain W and Wo Contrast  Result Date: 01/12/2021 CLINICAL DATA:  Headaches.  Dizziness and blurred vision. EXAM: MRI HEAD WITHOUT AND WITH CONTRAST TECHNIQUE: Multiplanar, multiecho pulse sequences of the brain and surrounding structures were obtained without and with intravenous contrast. CONTRAST:  29m GADAVIST GADOBUTROL 1 MMOL/ML IV SOLN COMPARISON:  CT head January 09, 2021. FINDINGS: Mildly motion limited study. Brain: Punctate focus of restricted diffusion in the right occipital lobe (series 5, image 73; series 6, image 19) with slightt edema (series 11, image 18). Punctate focus of restricted diffusion along the parieto-occipital sulcus on the right (series 5, image 85) with possible slight edema (series 11, image 30). Punctate focus of apparent restricted diffusion in the right parietal cortex (series 5, image 90) with slight associated edema (series 11, image 35). Additional faint punctate focus of DWI hyperintensity in the right frontal cortex (series 5, image 93). Possible additional tiny foci of restricted diffusion along bilateral perirolandic cortex (series 5, image 96) small remote lacunar infarct in the right cerebellum (series 8, image 7) and right caudate (series 8, image 16). Possible tiny foci of restricted diffusion in the right  greater than left perirolandic cortex and left frontal cortex (series 5 image 96) may represent additional punctate infarcts or artifact. No acute hemorrhage, mass lesion, midline shift, extra-axial fluid collection, or hydrocephalus. Vascular: Major arterial flow voids are maintained. Skull and upper cervical spine: Normal marrow signal. Sinuses/Orbits: Sinuses are largely clear.  Unremarkable orbits. Other: No mastoid effusions. IMPRESSION: 1. Multiple punctate foci of restricted diffusion in the right occipital, right (and possibly left) parietal and frontal cortex, described above and suspicious for acute or subacute punctate infarcts (over artifact) given suggestion of slight edema in some of these areas. Given the potential involvement of multiple vascular territories, consider a central embolic etiology. No significant mass effect. 2. Small remote lacunar infarcts in the right cerebellum and right caudate. Electronically Signed   By: FMargaretha SheffieldMD   On: 01/12/2021 12:54   CT VENOGRAM HEAD  Result Date: 01/12/2021 CLINICAL DATA:  Small acute infarctions. Dural venous sinus thrombosis suspected. EXAM: CT VENOGRAM HEAD TECHNIQUE: Venographic phase images of the brain were obtained following the administration of intravenous contrast. Multiplanar reformats and maximum intensity projections were generated. CONTRAST:  739mOMNIPAQUE IOHEXOL 350 MG/ML SOLN COMPARISON:  Arterial studies same day. FINDINGS: Superior sagittal sinus is widely patent. Both transverse and sigmoid sinuses appear normal. Both jugular veins show flow. Deep veins appear normal. No sign of superficial venous thrombosis. IMPRESSION: Normal CT venography. Electronically Signed   By: MaNelson Chimes.D.   On: 01/12/2021 14:54     Discharge Exam: Vitals:   01/13/21 0856 01/13/21 1236  BP: (!) 147/94 (!) 133/101  Pulse: 81 80  Resp: 15   Temp: 98.3 F (36.8 C) 98.6 F (37 C)  SpO2: 99% 100%   Vitals:  01/13/21 0346 01/13/21  0841 01/13/21 0856 01/13/21 1236  BP: 112/82 127/60 (!) 147/94 (!) 133/101  Pulse: 81 82 81 80  Resp: 16  15   Temp: 98.5 F (36.9 C)  98.3 F (36.8 C) 98.6 F (37 C)  TempSrc: Oral  Oral Oral  SpO2: 98% 100% 99% 100%  Weight:      Height:        General: Pt is alert, awake, not in acute distress Cardiovascular: RRR, S1/S2 +, no rubs, no gallops Respiratory: CTA bilaterally, no wheezing, no rhonchi Abdominal: Soft, NT, ND, bowel sounds + Extremities: no edema, no cyanosis    The results of significant diagnostics from this hospitalization (including imaging, microbiology, ancillary and laboratory) are listed below for reference.     Microbiology: Recent Results (from the past 240 hour(s))  Resp Panel by RT-PCR (Flu A&B, Covid) Nasopharyngeal Swab     Status: None   Collection Time: 01/12/21  1:30 PM   Specimen: Nasopharyngeal Swab; Nasopharyngeal(NP) swabs in vial transport medium  Result Value Ref Range Status   SARS Coronavirus 2 by RT PCR NEGATIVE NEGATIVE Final    Comment: (NOTE) SARS-CoV-2 target nucleic acids are NOT DETECTED.  The SARS-CoV-2 RNA is generally detectable in upper respiratory specimens during the acute phase of infection. The lowest concentration of SARS-CoV-2 viral copies this assay can detect is 138 copies/mL. A negative result does not preclude SARS-Cov-2 infection and should not be used as the sole basis for treatment or other patient management decisions. A negative result may occur with  improper specimen collection/handling, submission of specimen other than nasopharyngeal swab, presence of viral mutation(s) within the areas targeted by this assay, and inadequate number of viral copies(<138 copies/mL). A negative result must be combined with clinical observations, patient history, and epidemiological information. The expected result is Negative.  Fact Sheet for Patients:  EntrepreneurPulse.com.au  Fact Sheet for  Healthcare Providers:  IncredibleEmployment.be  This test is no t yet approved or cleared by the [place] FDA and  has been authorized for detection and/or diagnosis of SARS-CoV-2 by FDA under an Emergency Use Authorization (EUA). This EUA will remain  in effect (meaning this test can be used) for the duration of the COVID-19 declaration under Section 564(b)(1) of the Act, 21 U.S.C.section 360bbb-3(b)(1), unless the authorization is terminated  or revoked sooner.       Influenza A by PCR NEGATIVE NEGATIVE Final   Influenza B by PCR NEGATIVE NEGATIVE Final    Comment: (NOTE) The Xpert Xpress SARS-CoV-2/FLU/RSV plus assay is intended as an aid in the diagnosis of influenza from Nasopharyngeal swab specimens and should not be used as a sole basis for treatment. Nasal washings and aspirates are unacceptable for Xpert Xpress SARS-CoV-2/FLU/RSV testing.  Fact Sheet for Patients: EntrepreneurPulse.com.au  Fact Sheet for Healthcare Providers: IncredibleEmployment.be  This test is not yet approved or cleared by the [place] FDA and has been authorized for detection and/or diagnosis of SARS-CoV-2 by FDA under an Emergency Use Authorization (EUA). This EUA will remain in effect (meaning this test can be used) for the duration of the COVID-19 declaration under Section 564(b)(1) of the Act, 21 U.S.C. section 360bbb-3(b)(1), unless the authorization is terminated or revoked.  Performed at [facility], [place] [address], [place], [place] [address]   CSF culture w Gram Stain     Status: None (Preliminary result)   Collection Time: 01/12/21  3:40 PM   Specimen: CSF; Cerebrospinal Fluid  Result Value Ref Range  Status   Specimen Description CSF  Final   Special Requests NONE  Final   Gram Stain   Final    NO ORGANISMS SEEN NO WBC SEEN CYTOSPIN SMEAR Gram Stain Report Called to,Read Back By and Verified With:  S.WEST RN AT 9390 ON 01/12/21 BY S.VANHOORNE Performed at [facility], [place] [address] [place], [place] [address]    Culture PENDING  Incomplete   Report Status PENDING  Incomplete  Culture, fungus without smear     Status: None (Preliminary result)   Collection Time: 01/12/21  3:40 PM   Specimen: CSF; Cerebrospinal Fluid  Result Value Ref Range Status   Specimen Description   Final    CSF Performed at [place] [address], [place], [place] [address]    Special Requests   Final    NONE Performed at [facility], [place] [address] [place], [place] [address]    Culture   Final    NO GROWTH < 24 HOURS Performed at [place] [address], [place], [place] [address]    Report Status PENDING  Incomplete     Labs: BNP (last 3 results) No results for input(s): BNP in the last 8760 hours. Basic Metabolic Panel: Recent Labs  Lab 01/12/21 0819 01/12/21 1046 01/12/21 1348  NA 139 137 138  K 4.9 4.1 4.2  CL 103 103 105  CO2 23  --  24  GLUCOSE 95 92 93  BUN 7 6 7   CREATININE 0.78 0.80 0.65  CALCIUM 8.9  --  8.7*   Liver Function Tests: Recent Labs  Lab 01/12/21 0819 01/12/21 1348  AST 16 19  ALT 12 16  ALKPHOS 56 43  BILITOT <0.2 0.5  PROT 6.7 6.6  ALBUMIN 4.1 3.9   No results for input(s): LIPASE, AMYLASE in the last 168 hours. No results for input(s): AMMONIA in the last 168 hours. CBC: Recent Labs  Lab 01/12/21 0819 01/12/21 1046 01/12/21 1348  WBC 7.6  --  8.6  NEUTROABS 3.1  --  4.5  HGB 13.1 14.6 12.7  HCT 40.6 43.0 40.2  MCV 86  --  88.9  PLT 281  --  263   Cardiac Enzymes: No results for input(s): CKTOTAL, CKMB, CKMBINDEX, TROPONINI in the last 168 hours. BNP: Invalid input(s): POCBNP CBG: No results for input(s): GLUCAP in the last 168 hours. D-Dimer No results for input(s): DDIMER in the last 72 hours. Hgb A1c Recent Labs    01/12/21 0819 01/13/21 0335  HGBA1C 5.4  5.3   Lipid Profile Recent Labs    01/13/21 0335  CHOL 223*  HDL 35*  LDLCALC 164*  TRIG 119  CHOLHDL 6.4   Thyroid function studies Recent Labs    01/12/21 0819  TSH 0.820   Anemia work up Recent Labs    01/12/21 0819  VITAMINB12 561  FOLATE 5.7   Urinalysis    Component Value Date/Time   COLORURINE STRAW (A) 01/12/2021 1407   APPEARANCEUR CLEAR 01/12/2021 1407   LABSPEC 1.009 01/12/2021 1407   PHURINE 9.0 (H) 01/12/2021 1407   GLUCOSEU NEGATIVE 01/12/2021 1407   [place] 01/12/2021 1407   [place] 01/12/2021 1407   [place] 01/12/2021 1407   PROTEINUR NEGATIVE 01/12/2021 1407   NITRITE NEGATIVE 01/12/2021 1407   LEUKOCYTESUR TRACE (A) 01/12/2021 1407   Sepsis Labs Invalid input(s): PROCALCITONIN,  WBC,  LACTICIDVEN Microbiology Recent Results (from the past 240 hour(s))  Resp Panel  by RT-PCR (Flu A&B, Covid) Nasopharyngeal Swab     Status: None   Collection Time: 01/12/21  1:30 PM   Specimen: Nasopharyngeal Swab; Nasopharyngeal(NP) swabs in vial transport medium  Result Value Ref Range Status   SARS Coronavirus 2 by RT PCR NEGATIVE NEGATIVE Final    Comment: (NOTE) SARS-CoV-2 target nucleic acids are NOT DETECTED.  The SARS-CoV-2 RNA is generally detectable in upper respiratory specimens during the acute phase of infection. The lowest concentration of SARS-CoV-2 viral copies this assay can detect is 138 copies/mL. A negative result does not preclude SARS-Cov-2 infection and should not be used as the sole basis for treatment or other patient management decisions. A negative result may occur with  improper specimen collection/handling, submission of specimen other than nasopharyngeal swab, presence of viral mutation(s) within the areas targeted by this assay, and inadequate number of viral copies(<138 copies/mL). A negative result must be combined with clinical observations, patient history, and epidemiological information.  The expected result is Negative.  Fact Sheet for Patients:  EntrepreneurPulse.com.au  Fact Sheet for Healthcare Providers:  IncredibleEmployment.be  This test is no t yet approved or cleared by the [place] FDA and  has been authorized for detection and/or diagnosis of SARS-CoV-2 by FDA under an Emergency Use Authorization (EUA). This EUA will remain  in effect (meaning this test can be used) for the duration of the COVID-19 declaration under Section 564(b)(1) of the Act, 21 U.S.C.section 360bbb-3(b)(1), unless the authorization is terminated  or revoked sooner.       Influenza A by PCR NEGATIVE NEGATIVE Final   Influenza B by PCR NEGATIVE NEGATIVE Final    Comment: (NOTE) The Xpert Xpress SARS-CoV-2/FLU/RSV plus assay is intended as an aid in the diagnosis of influenza from Nasopharyngeal swab specimens and should not be used as a sole basis for treatment. Nasal washings and aspirates are unacceptable for Xpert Xpress SARS-CoV-2/FLU/RSV testing.  Fact Sheet for Patients: EntrepreneurPulse.com.au  Fact Sheet for Healthcare Providers: IncredibleEmployment.be  This test is not yet approved or cleared by the [place] FDA and has been authorized for detection and/or diagnosis of SARS-CoV-2 by FDA under an Emergency Use Authorization (EUA). This EUA will remain in effect (meaning this test can be used) for the duration of the COVID-19 declaration under Section 564(b)(1) of the Act, 21 U.S.C. section 360bbb-3(b)(1), unless the authorization is terminated or revoked.  Performed at [facility], [place] [address], [place], [place] [address]   CSF culture w Gram Stain     Status: None (Preliminary result)   Collection Time: 01/12/21  3:40 PM   Specimen: CSF; Cerebrospinal Fluid  Result Value Ref Range Status   Specimen Description CSF  Final   Special Requests NONE  Final   Gram  Stain   Final    NO ORGANISMS SEEN NO WBC SEEN CYTOSPIN SMEAR Gram Stain Report Called to,Read Back By and Verified With: S.WEST RN AT 1245 ON 01/12/21 BY S.VANHOORNE Performed at [facility], [place] [address] [place], [place] [address]    Culture PENDING  Incomplete   Report Status PENDING  Incomplete  Culture, fungus without smear     Status: None (Preliminary result)   Collection Time: 01/12/21  3:40 PM   Specimen: CSF; Cerebrospinal Fluid  Result Value Ref Range Status   Specimen Description   Final    CSF Performed at [place] [address], [place], [place] [address]    Special Requests   Final  NONE Performed at [facility], [place] [address] [place], [place] [address]    Culture   Final    NO GROWTH < 24 HOURS Performed at [place] [address], [place], [place] [address]    Report Status PENDING  Incomplete     Time coordinating discharge: Over 30 minutes  SIGNED:   Darliss Cheney, MD  [facility] 01/13/2021, 1:10 PM  If 7PM-7AM, please contact night-coverage [email or web address]

## 2021-01-13 NOTE — Progress Notes (Signed)
  Echocardiogram 2D Echocardiogram has been performed.  Mary Kane 01/13/2021, 11:37 AM

## 2021-01-13 NOTE — Progress Notes (Signed)
STROKE TEAM PROGRESS NOTE   INTERVAL HISTORY Her speech therapist is at the bedside.  Patient stated that her headache improved to some from yesterday.  She did have migraine from time to time but not frequent at all.  This time headache lasting much longer than normal her migraine headache.  She is educated on smoking cessation.  She stated that she had history of ovarian cyst and had 1 ovary removed, therefore she is on estrogen prescribed by her OB/GYN.  I encouraged her to discuss with her OB/GYN regarding further estrogen use.  By currently I recommend to stop at the time being before talking to her OB/GYN.  She has to go to her best friend's wedding in [place] in 2 days which has been postponed due to the pandemic.  I let her be aware that according to the aviation guideline we do not recommend patients have air travel within 2 weeks of stroke.  Although her stroke are punctate infarcts, there is still a small risk for air travel, she is made aware.  Regarding statin use, according to the guidelines, she need alternate means for contraception.  Stop statin 2 months before seeking for pregnancy, if pregnant while taking statin, recommend stop statin right away.  Vitals:   01/12/21 2043 01/13/21 0012 01/13/21 0346 01/13/21 0841  BP: (!) 147/91 115/76 112/82 127/60  Pulse: (!) 106 85 81 82  Resp: '18 18 16   '$ Temp: 98 F (36.7 C) 98.1 F (36.7 C) 98.5 F (36.9 C)   TempSrc: Oral Oral Oral   SpO2: 98% 99% 98% 100%  Weight:      Height:       CBC:  Recent Labs  Lab 01/12/21 0819 01/12/21 1046 01/12/21 1348  WBC 7.6  --  8.6  NEUTROABS 3.1  --  4.5  HGB 13.1 14.6 12.7  HCT 40.6 43.0 40.2  MCV 86  --  88.9  PLT 281  --  99991111   Basic Metabolic Panel:  Recent Labs  Lab 01/12/21 0819 01/12/21 1046 01/12/21 1348  NA 139 137 138  K 4.9 4.1 4.2  CL 103 103 105  CO2 23  --  24  GLUCOSE 95 92 93  BUN '7 6 7  '$ CREATININE 0.78 0.80 0.65  CALCIUM 8.9  --  8.7*   Lipid Panel:  Recent  Labs  Lab 01/13/21 0335  CHOL 223*  TRIG 119  HDL 35*  CHOLHDL 6.4  VLDL 24  LDLCALC 164*   HgbA1c:  Recent Labs  Lab 01/13/21 0335  HGBA1C 5.3   Urine Drug Screen:  Recent Labs  Lab 01/12/21 1407  LABOPIA NONE DETECTED  COCAINSCRNUR NONE DETECTED  LABBENZ NONE DETECTED  AMPHETMU NONE DETECTED  THCU POSITIVE*  LABBARB NONE DETECTED    Alcohol Level  Recent Labs  Lab 01/12/21 1348  ETH <10    IMAGING past 24 hours CT Angio Head W or Wo Contrast  Result Date: 01/12/2021 CLINICAL DATA:  Neurological deficit, acute, stroke suspected. Headache over the last 10 days. EXAM: CT ANGIOGRAPHY HEAD AND NECK TECHNIQUE: Multidetector CT imaging of the head and neck was performed using the standard protocol during bolus administration of intravenous contrast. Multiplanar CT image reconstructions and MIPs were obtained to evaluate the vascular anatomy. Carotid stenosis measurements (when applicable) are obtained utilizing NASCET criteria, using the distal internal carotid diameter as the denominator. CONTRAST:  19m OMNIPAQUE IOHEXOL 350 MG/ML SOLN COMPARISON:  MRI same day.  Head CT 01/09/2021. FINDINGS: CT HEAD  Brain: The brain remains normal by CT. No sign of old or acute infarction, mass lesion, hemorrhage, hydrocephalus or extra-axial collection. Punctate acute infarctions shown by MRI would likely be inapparent. Vascular: No abnormal vascular finding. Skull: Normal Sinuses: Clear/normal Other: None CTA NECK FINDINGS Aortic arch: Aortic arch is normal.  Branching pattern is normal. Right carotid system: Common carotid artery widely patent to the bifurcation. Carotid bifurcation is normal. Cervical ICA is normal. No sign of dissection. Left carotid system: Common carotid artery widely patent to the bifurcation. Carotid bifurcation is normal. Cervical ICA is normal. No sign of dissection. Vertebral arteries: Both vertebral artery origins are normal. Both vertebral arteries are normal through  the cervical region to the foramen magnum. Skeleton: Normal Other neck: No mass or lymphadenopathy. Upper chest: Normal Review of the MIP images confirms the above findings CTA HEAD FINDINGS Anterior circulation: Both internal carotid arteries are widely patent through the skull base and siphon regions. The anterior and middle cerebral vessels are normal. No large vessel occlusion. No proximal stenosis. No aneurysm or vascular malformation. Posterior circulation: Both vertebral arteries widely patent through the foramen magnum to the basilar. No basilar stenosis. Posterior circulation branch vessels are normal. Venous sinuses: Patent and normal. Anatomic variants: None significant. Review of the MIP images confirms the above findings IMPRESSION: Head CT remains normal. Normal CT angiography of the neck and head. No large vessel occlusion. No evidence of aortic arch pathology. No evidence of dissection in the neck. Electronically Signed   By: Nelson Chimes M.D.   On: 01/12/2021 14:52   CT Angio Neck W and/or Wo Contrast  Result Date: 01/12/2021 CLINICAL DATA:  Neurological deficit, acute, stroke suspected. Headache over the last 10 days. EXAM: CT ANGIOGRAPHY HEAD AND NECK TECHNIQUE: Multidetector CT imaging of the head and neck was performed using the standard protocol during bolus administration of intravenous contrast. Multiplanar CT image reconstructions and MIPs were obtained to evaluate the vascular anatomy. Carotid stenosis measurements (when applicable) are obtained utilizing NASCET criteria, using the distal internal carotid diameter as the denominator. CONTRAST:  65m OMNIPAQUE IOHEXOL 350 MG/ML SOLN COMPARISON:  MRI same day.  Head CT 01/09/2021. FINDINGS: CT HEAD Brain: The brain remains normal by CT. No sign of old or acute infarction, mass lesion, hemorrhage, hydrocephalus or extra-axial collection. Punctate acute infarctions shown by MRI would likely be inapparent. Vascular: No abnormal vascular  finding. Skull: Normal Sinuses: Clear/normal Other: None CTA NECK FINDINGS Aortic arch: Aortic arch is normal.  Branching pattern is normal. Right carotid system: Common carotid artery widely patent to the bifurcation. Carotid bifurcation is normal. Cervical ICA is normal. No sign of dissection. Left carotid system: Common carotid artery widely patent to the bifurcation. Carotid bifurcation is normal. Cervical ICA is normal. No sign of dissection. Vertebral arteries: Both vertebral artery origins are normal. Both vertebral arteries are normal through the cervical region to the foramen magnum. Skeleton: Normal Other neck: No mass or lymphadenopathy. Upper chest: Normal Review of the MIP images confirms the above findings CTA HEAD FINDINGS Anterior circulation: Both internal carotid arteries are widely patent through the skull base and siphon regions. The anterior and middle cerebral vessels are normal. No large vessel occlusion. No proximal stenosis. No aneurysm or vascular malformation. Posterior circulation: Both vertebral arteries widely patent through the foramen magnum to the basilar. No basilar stenosis. Posterior circulation branch vessels are normal. Venous sinuses: Patent and normal. Anatomic variants: None significant. Review of the MIP images confirms the above findings IMPRESSION: Head  CT remains normal. Normal CT angiography of the neck and head. No large vessel occlusion. No evidence of aortic arch pathology. No evidence of dissection in the neck. Electronically Signed   By: Nelson Chimes M.D.   On: 01/12/2021 14:52   MR Brain W and Wo Contrast  Result Date: 01/12/2021 CLINICAL DATA:  Headaches.  Dizziness and blurred vision. EXAM: MRI HEAD WITHOUT AND WITH CONTRAST TECHNIQUE: Multiplanar, multiecho pulse sequences of the brain and surrounding structures were obtained without and with intravenous contrast. CONTRAST:  41m GADAVIST GADOBUTROL 1 MMOL/ML IV SOLN COMPARISON:  CT head January 09, 2021.  FINDINGS: Mildly motion limited study. Brain: Punctate focus of restricted diffusion in the right occipital lobe (series 5, image 73; series 6, image 19) with slightt edema (series 11, image 18). Punctate focus of restricted diffusion along the parieto-occipital sulcus on the right (series 5, image 85) with possible slight edema (series 11, image 30). Punctate focus of apparent restricted diffusion in the right parietal cortex (series 5, image 90) with slight associated edema (series 11, image 35). Additional faint punctate focus of DWI hyperintensity in the right frontal cortex (series 5, image 93). Possible additional tiny foci of restricted diffusion along bilateral perirolandic cortex (series 5, image 96) small remote lacunar infarct in the right cerebellum (series 8, image 7) and right caudate (series 8, image 16). Possible tiny foci of restricted diffusion in the right greater than left perirolandic cortex and left frontal cortex (series 5 image 96) may represent additional punctate infarcts or artifact. No acute hemorrhage, mass lesion, midline shift, extra-axial fluid collection, or hydrocephalus. Vascular: Major arterial flow voids are maintained. Skull and upper cervical spine: Normal marrow signal. Sinuses/Orbits: Sinuses are largely clear.  Unremarkable orbits. Other: No mastoid effusions. IMPRESSION: 1. Multiple punctate foci of restricted diffusion in the right occipital, right (and possibly left) parietal and frontal cortex, described above and suspicious for acute or subacute punctate infarcts (over artifact) given suggestion of slight edema in some of these areas. Given the potential involvement of multiple vascular territories, consider a central embolic etiology. No significant mass effect. 2. Small remote lacunar infarcts in the right cerebellum and right caudate. Electronically Signed   By: FMargaretha SheffieldMD   On: 01/12/2021 12:54   CT VENOGRAM HEAD  Result Date: 01/12/2021 CLINICAL DATA:   Small acute infarctions. Dural venous sinus thrombosis suspected. EXAM: CT VENOGRAM HEAD TECHNIQUE: Venographic phase images of the brain were obtained following the administration of intravenous contrast. Multiplanar reformats and maximum intensity projections were generated. CONTRAST:  739mOMNIPAQUE IOHEXOL 350 MG/ML SOLN COMPARISON:  Arterial studies same day. FINDINGS: Superior sagittal sinus is widely patent. Both transverse and sigmoid sinuses appear normal. Both jugular veins show flow. Deep veins appear normal. No sign of superficial venous thrombosis. IMPRESSION: Normal CT venography. Electronically Signed   By: MaNelson Chimes.D.   On: 01/12/2021 14:54    PHYSICAL EXAM General: Appears well-developed, tearful during conversation. Psych: Affect appropriate to situation Eyes: No scleral injection HENT: No OP obstrucion Head: Normocephalic.  Cardiovascular: Normal rate and regular rhythm.  Respiratory: Effort normal and breath sounds normal to anterior ascultation GI: Soft.  No distension. There is no tenderness.  Skin: WDI    Neurological Examination Mental Status: Alert, oriented, thought content appropriate.  Speech fluent without evidence of aphasia. Able to follow 3 step commands without difficulty. Cranial Nerves: II: Visual fields grossly normal,  III,IV, VI: ptosis not present, extra-ocular motions intact bilaterally, pupils equal, round, reactive to  light and accommodation V,VII: smile symmetric, facial light touch sensation normal bilaterally VIII: hearing normal bilaterally IX,X: uvula rises symmetrically XI: bilateral shoulder shrug XII: midline tongue extension Motor: Right : Upper extremity   5/5    Left:     Upper extremity   5/5  Lower extremity   5/5     Lower extremity   5/5 Tone and bulk:normal tone throughout; no atrophy noted Sensory: Pinprick and light touch intact throughout, bilaterally Deep Tendon Reflexes: 2+ and symmetric throughout Plantars: Right:  downgoing   Left: downgoing Cerebellar: normal finger-to-nose, normal rapid alternating movements and normal heel-to-shin test Gait: normal gait and station   ASSESSMENT/PLAN Ms. Mariauna Prowse is a 30 y.o. female with history of ADD, smoker, anxiety, OCP use due to unilateral ovary removal with cyst presenting with headache for 10 to 15 days.   Stroke:  3-4 right frontal and occipital punctate infarcts, etiology unclear.  CT head 7/25 No acute abnormality. ASPECTS 10.    CTA head & neck unremarkable, no vasculitis CTV - no CVST, negative MRI  3-4 right frontal and occipital punctate infarcts TCD bubble study no PFO or ASD 2D Echo pending Pt would not stay for TEE, will schedule as outpt during neuro follow up Hypercoagulable work up pending CSF clean with protein 18 and WBC 1 - not consistent with vasculitis LDL 164 HgbA1c 5.3 VTE prophylaxis - SCDs No antithrombotic prior to admission, now on aspirin 81 mg daily and clopidogrel 75 mg daily for 3 weeks and then ASA alone.  Therapy recommendations:  none Disposition:  home today  Hx of migraine Infrequent Current headache different from normal migraine Continue home propranolol Follow-up with neurology  OCP use unilateral ovary removal due to cyst OCP prescribed by OBGYN Recommend patient follow-up with OB/GYN to discuss further options Hold on OCP for now  Tobacco abuse Current smoker Smoking cessation counseling provided Increased thrombotic event with a smoking and OCP use Pt is willing to quit  Hyperlipidemia Home meds: None LDL 164, goal < 70 Add Lipitor 80 according to the guidelines, she need alternate means for contraception.  Stop statin 2 months before seeking for pregnancy, if pregnant while taking statin, recommend stop statin right away.  Educated patient on this issue. Continue statin at discharge  Other Stroke Risk Factors Substance abuse - UDS:  THC POSITIVE. Patient advised to stop using due to  stroke risk.  Other Active Problems ADD Anxiety on Seroquel Depression on Cymbalta  Hospital day # 0  Neurology will sign off. Please call with questions. Pt will follow up with stroke clinic Dr. Leonie Man at [facility] in about 4 weeks. Thanks for the consult.  Rosalin Hawking, MD PhD Stroke Neurology 01/13/2021 1:28 PM    To contact Stroke Continuity provider, please refer to [email or web address] After hours, contact General Neurology

## 2021-01-13 NOTE — TOC Transition Note (Signed)
Transition of Care [facility]) - CM/SW Discharge Note   Patient Details  Name: Sina Hosea MRN: [id number] Date of Birth: February 18, 1991  [facility]) CM/SW Contact:  Pollie Friar, RN Phone Number: 01/13/2021, 1:31 PM   Clinical Narrative:    Patient discharging home with self care. No f/u per PT/OT and no DME needs.  PCP arranged with [facility] with Appt on AVS. Pt has supervision at home and transportation to home.   Final next level of care: Home/Self Care Barriers to Discharge: No Barriers Identified   Patient Goals and CMS Choice        Discharge Placement                       Discharge Plan and Services                                     Social Determinants of Health (SDOH) Interventions     Readmission Risk Interventions No flowsheet data found.

## 2021-01-13 NOTE — Evaluation (Signed)
Physical Therapy Evaluation & Discharge Patient Details Name: Mary Kane MRN: [id number] DOB: 1991/05/14 Today's Date: 01/13/2021   History of Present Illness  30 y/o female presented to ED initially on 7/25 for headache x 1 week with sensitivity to light. At the time, CT head negative. Patient returned to ED on 7/28 with continued headache requesting MRI. 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 CT venogram unremarkable. CT angiogram negative for large vessel obstruction. Lumbar puncture normal. PMH: ADD, anxiety, depression, tobacco use  Clinical Impression  Patient admitted with above diagnosis. PTA, patient lives with parents and reports independence. Patient currently functioning at independent level for mobility. Patient tearful throughout session due to fear of current situation and being in hospital. Educated patient on smoking cessation and importance of physical activity, patient verbalized understanding. No further skilled PT needs required acutely. No PT follow up recommended at this time.     Follow Up Recommendations No PT follow up    Equipment Recommendations  None recommended by PT    Recommendations for Other Services       Precautions / Restrictions Precautions Precautions: None Restrictions Weight Bearing Restrictions: No      Mobility  Bed Mobility Overal bed mobility: Independent                  Transfers Overall transfer level: Independent Equipment used: None                Ambulation/Gait Ambulation/Gait assistance: Independent Gait Distance (Feet): 200 Feet Assistive device: None Gait Pattern/deviations: WFL(Within Functional Limits)   Gait velocity interpretation: >4.37 ft/sec, indicative of normal walking speed    Stairs Stairs:  Yes Stairs assistance: Independent Stair Management: No rails;Alternating pattern;Forwards Number of Stairs: 2    Wheelchair Mobility    Modified Rankin (Stroke Patients Only) Modified Rankin (Stroke Patients Only) Pre-Morbid Rankin Score: No symptoms Modified Rankin: No symptoms     Balance Overall balance assessment: Independent                                           Pertinent Vitals/Pain Pain Assessment: 0-10 Pain Score: 3  Pain Location: head Pain Descriptors / Indicators: Headache Pain Intervention(s): Monitored during session    Home Living Family/patient expects to be discharged to:: Private residence Living Arrangements: Parent Available Help at Discharge: Family;Available PRN/intermittently Type of Home: House Home Access: Stairs to enter Entrance Stairs-Rails: Right;Left;Can reach both Entrance Stairs-Number of Steps: 2 Home Layout: Two level;Bed/bath upstairs Home Equipment: None      Prior Function Level of Independence: Independent         Comments: Theatre manager, drives     Hand Dominance        Extremity/Trunk Assessment   Upper Extremity Assessment Upper Extremity Assessment: Overall WFL for tasks assessed    Lower Extremity Assessment Lower Extremity Assessment: Overall WFL for tasks assessed    Cervical / Trunk Assessment Cervical / Trunk Assessment: Normal  Communication   Communication: No difficulties  Cognition Arousal/Alertness: Awake/alert Behavior During Therapy: Anxious Overall Cognitive Status: Within Functional Limits for tasks assessed  General Comments: tearful throughout session due to fear of being in hospital and current situation      General Comments      Exercises     Assessment/Plan    PT Assessment Patent does not need any further PT services  PT Problem List         PT Treatment Interventions      PT Goals (Current  goals can be found in the Care Plan section)  Acute Rehab PT Goals Patient Stated Goal: to go on my trip next week PT Goal Formulation: All assessment and education complete, DC therapy    Frequency     Barriers to discharge        Co-evaluation               AM-PAC PT "6 Clicks" Mobility  Outcome Measure Help needed turning from your back to your side while in a flat bed without using bedrails?: None Help needed moving from lying on your back to sitting on the side of a flat bed without using bedrails?: None Help needed moving to and from a bed to a chair (including a wheelchair)?: None Help needed standing up from a chair using your arms (e.g., wheelchair or bedside chair)?: None Help needed to walk in hospital room?: None Help needed climbing 3-5 steps with a railing? : None 6 Click Score: 24    End of Session   Activity Tolerance: Patient tolerated treatment well Patient left: in bed;with call bell/phone within reach;with bed alarm set Nurse Communication: Mobility status PT Visit Diagnosis: Muscle weakness (generalized) (M62.81)    Time: [id number] PT Time Calculation (min) (ACUTE ONLY): 18 min   Charges:   PT Evaluation $PT Eval Low Complexity: 1 Low          Saman Umstead A. Gilford Rile PT, DPT Acute Rehabilitation Services Pager [phone] Office [phone]   Linna Hoff 01/13/2021, 9:14 AM

## 2021-01-13 NOTE — Evaluation (Signed)
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   Dermoid cyst of ovary, right    Past Medical History:  Past Medical History:  Diagnosis Date   ADD (attention deficit disorder)    Anxiety    Depression    Dermoid cyst    History of iron deficiency anemia    Insomnia    Mental disorder    Wears contact lenses    Wears glasses    Past Surgical History:  Past Surgical History:  Procedure Laterality Date   LAPAROSCOPIC OVARIAN CYSTECTOMY Right 12/29/2019   Procedure: Laparoscopic right oophorectomy;  Surgeon: Mora Bellman, MD;  Location: [place];  Service: Gynecology;  Laterality: Right;   RHINOPLASTY     WISDOM TOOTH EXTRACTION     HPI:  Pt is a 30 y/o female presented to ED initially on 7/25 for headache x 1 week with sensitivity to light. At the time, CT head negative. Patient returned to ED on 7/28 with continued headache requesting MRI. 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 CT venogram unremarkable. CT angiogram negative for large vessel obstruction. Lumbar puncture normal. PMH: ADD, anxiety, depression, tobacco use.   Assessment / Plan / Recommendation Clinical Impression  Pt participated in  speech/language/cognition evaluation. 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 She denied any significant baseline deficits in speech, language, or cognition, but she stated that she has been diagnosed with ADHD. Pt denied any acute changes in any of the aforementioned areas. The [facility] Mental Status Examination was completed to evaluate the pt's cognitive-linguistic skills. She achieved a score of 26/30 which is slightly below the normal limits of 27 or more out of 30. Pt exhibited difficulty during tasks which recalled recall and she attributed this difficulty to her baseline ADHD. It is also noteworthy that pt was upset and tearful for the latter portion of the assessment since she has a trip to [place] planned for Tuesday to attend a wedding, and she was told by neurology during the evaluation that this is not recommended. The impact of this on her performance is considered. No speech or language deficits were noted and her performance on informal cognitive-linguistic tasks was within normal limits. Further acute skilled SLP services are not clinically indicated at this time. However, pt has verbalized agreement with following up with her PCP if she notices increased difficulty with attention, memory, or other cognitive areas after discharge.     SLP Assessment  SLP Recommendation/Assessment: Patient does not need any further Speech Lanaguage Pathology Services SLP Visit Diagnosis: Cognitive communication deficit (R41.841)    Follow Up Recommendations  None    Frequency and Duration           SLP Evaluation Cognition  Overall Cognitive Status: Within Functional Limits for tasks  assessed Arousal/Alertness: Awake/alert Orientation Level: Oriented X4 Attention: Focused;Sustained Focused Attention: Appears intact Sustained Attention: Appears intact Memory: Impaired Memory Impairment: Retrieval deficit;Decreased recall of new  information (Immediate: 5/5; delayed: 3/5; with cues: 1/2; paragraph: 6/8) Awareness: Appears intact Problem Solving: Appears intact Executive Function: Reasoning;Sequencing;Organizing Reasoning: Appears intact Sequencing: Appears intact (Clock drawing: 4/4) Organizing: Appears intact (backward digit span: 2/2) Behaviors: Lability       Comprehension  Auditory Comprehension Overall Auditory Comprehension: Appears within functional limits for tasks assessed Yes/No Questions: Within Functional Limits Commands: Within Functional Limits Conversation: Complex Visual Recognition/Discrimination Discrimination: Within Function Limits Reading Comprehension Reading Status: Within funtional limits    Expression Expression Primary Mode of Expression: Verbal Verbal Expression Overall Verbal Expression: Appears within functional limits for tasks assessed Initiation: No impairment Level of Generative/Spontaneous Verbalization: Conversation Repetition: No impairment Naming: No impairment Pragmatics: No impairment Written Expression Dominant Hand: Right   Oral / Motor  Oral Motor/Sensory Function Overall Oral Motor/Sensory Function: Within functional limits Motor Speech Overall Motor Speech: Appears within functional limits for tasks assessed Respiration: Within functional limits Phonation: Normal Resonance: Within functional limits Articulation: Within functional limitis Intelligibility: Intelligible Motor Planning: Witnin functional limits Motor Speech Errors: Not applicable   Derinda Bartus I. Hardin Negus, [place], [place] Office number [phone] Pager [place] 01/13/2021, 1:42 PM

## 2021-01-14 ENCOUNTER — Telehealth: Payer: Self-pay | Admitting: Neurology

## 2021-01-14 LAB — BLOOD CULTURE ID PANEL (REFLEXED) - BCID2

## 2021-01-14 LAB — CARDIOLIPIN ANTIBODIES, IGG, IGM, IGA
Anticardiolipin IgA: <9 APL U/mL (ref 0–11)
Anticardiolipin IgG: <9 GPL U/mL (ref 0–14)
Anticardiolipin IgM: <9 MPL U/mL (ref 0–12)

## 2021-01-14 LAB — PROTEIN C, TOTAL: Protein C, Total: 135 % (ref 60–150)

## 2021-01-14 LAB — HSV 1/2 PCR, CSF
HSV-1 DNA: NEGATIVE
HSV-2 DNA: NEGATIVE

## 2021-01-14 LAB — HOMOCYSTEINE: Homocysteine: 8.7 umol/L (ref 0.0–14.5)

## 2021-01-14 NOTE — Telephone Encounter (Signed)
Discussed with the patient that preliminary blood culture results showed possible contamination versus evidence of a bloodstream infection.  She reported that she has more energy than usual today and has been fairly anxious given the medication adjustments she has made recently such as stopping her Xanax.  She is not having any fevers, not taking any antipyretics other than the 81 mg of aspirin she was started on for secondary stroke prevention, and her headache remains stable and is not noticeably positional.  She expressed understanding that the result is currently indeterminant and in process and needs to be finalized.  She is not leaving the country until Tuesday per her report to me and therefore will be available at the number in her chart for updates on the finalized blood culture results.  She did start to ask multiple additional questions about her headache and how quickly she needs to get her TEE completed, and we discussed that she should continue the plan as communicated to her at her discharge by the team who was directly taking care of her unless there is a substantial change in her clinical status.  Lesleigh Noe MD-PhD [facility] [phone]

## 2021-01-14 NOTE — Progress Notes (Signed)
PHARMACY - PHYSICIAN COMMUNICATION CRITICAL VALUE ALERT - BLOOD CULTURE IDENTIFICATION (BCID)  Loriann Wessler is an 30 y.o. female who presented to [facility] on 01/12/2021 with a chief complaint of headache  Assessment:  1 out of 4 blood cultures positive for GPCs in clusters - likely contaminant   Name of physician (or Provider) Contacted: Patient discharged  Current antibiotics: None  Changes to prescribed antibiotics recommended:  Patient is on recommended antibiotics - No changes needed  Results for orders placed or performed during the hospital encounter of 01/12/21  Blood Culture ID Panel (Reflexed) (Collected: 01/12/2021  5:34 PM)  Result Value Ref Range   Enterococcus faecalis NOT DETECTED NOT DETECTED   Enterococcus Faecium NOT DETECTED NOT DETECTED   Listeria monocytogenes NOT DETECTED NOT DETECTED   Staphylococcus species NOT DETECTED NOT DETECTED   Staphylococcus aureus (BCID) NOT DETECTED NOT DETECTED   Staphylococcus epidermidis NOT DETECTED NOT DETECTED   Staphylococcus lugdunensis NOT DETECTED NOT DETECTED   Streptococcus species NOT DETECTED NOT DETECTED   Streptococcus agalactiae NOT DETECTED NOT DETECTED   Streptococcus pneumoniae NOT DETECTED NOT DETECTED   Streptococcus pyogenes NOT DETECTED NOT DETECTED   A.calcoaceticus-baumannii NOT DETECTED NOT DETECTED   Bacteroides fragilis NOT DETECTED NOT DETECTED   Enterobacterales NOT DETECTED NOT DETECTED   Enterobacter cloacae complex NOT DETECTED NOT DETECTED   Escherichia coli NOT DETECTED NOT DETECTED   Klebsiella aerogenes NOT DETECTED NOT DETECTED   Klebsiella oxytoca NOT DETECTED NOT DETECTED   Klebsiella pneumoniae NOT DETECTED NOT DETECTED   Proteus species NOT DETECTED NOT DETECTED   Salmonella species NOT DETECTED NOT DETECTED   Serratia marcescens NOT DETECTED NOT DETECTED   Haemophilus influenzae NOT DETECTED NOT DETECTED   Neisseria meningitidis NOT DETECTED NOT DETECTED   Pseudomonas aeruginosa  NOT DETECTED NOT DETECTED   Stenotrophomonas maltophilia NOT DETECTED NOT DETECTED   Candida albicans NOT DETECTED NOT DETECTED   Candida auris NOT DETECTED NOT DETECTED   Candida glabrata NOT DETECTED NOT DETECTED   Candida krusei NOT DETECTED NOT DETECTED   Candida parapsilosis NOT DETECTED NOT DETECTED   Candida tropicalis NOT DETECTED NOT DETECTED   Cryptococcus neoformans/gattii NOT DETECTED NOT DETECTED    Corinda Gubler 01/14/2021  9:10 AM

## 2021-01-16 LAB — ANCA TITERS
Atypical P-ANCA titer: <1:20 {titer}
C-ANCA: <1:20 {titer}
P-ANCA: <1:20 {titer}

## 2021-01-16 LAB — VDRL, CSF: VDRL Quant, CSF: NONREACTIVE

## 2021-01-16 LAB — CSF CULTURE W GRAM STAIN
Culture: NO GROWTH
Gram Stain: NONE SEEN

## 2021-01-16 LAB — CULTURE, BLOOD (ROUTINE X 2): Special Requests: ADEQUATE

## 2021-01-16 LAB — ANA: Anti Nuclear Antibody (ANA): POSITIVE — AB

## 2021-01-17 ENCOUNTER — Other Ambulatory Visit: Payer: Self-pay

## 2021-01-17 LAB — PROTEIN S ACTIVITY: Protein S Activity: 66 % (ref 63–140)

## 2021-01-17 LAB — PROTEIN S, TOTAL: Protein S Ag, Total: 57 % — ABNORMAL LOW (ref 60–150)

## 2021-01-17 LAB — OLIGOCLONAL BANDS, CSF + SERM

## 2021-01-17 LAB — BETA-2-GLYCOPROTEIN I ABS, IGG/M/A
Beta-2 Glyco I IgG: <9 GPI IgG units (ref 0–20)
Beta-2-Glycoprotein I IgA: <9 GPI IgA units (ref 0–25)
Beta-2-Glycoprotein I IgM: <9 GPI IgM units (ref 0–32)

## 2021-01-17 LAB — PROTEIN C ACTIVITY: Protein C Activity: 145 % (ref 73–180)

## 2021-01-17 LAB — CULTURE, BLOOD (ROUTINE X 2)
Culture: NO GROWTH
Special Requests: ADEQUATE

## 2021-01-17 LAB — LUPUS ANTICOAGULANT PANEL
DRVVT: 41.8 s (ref 0.0–47.0)
PTT Lupus Anticoagulant: 37.8 s (ref 0.0–51.9)

## 2021-01-17 MED ORDER — FLUCONAZOLE 150 MG PO TABS: 150.0000 mg | ORAL_TABLET | Freq: Once | ORAL | 0 refills | Status: AC

## 2021-01-17 NOTE — Telephone Encounter (Signed)
Patient is requesting a refill on Diflucan.

## 2021-01-19 ENCOUNTER — Ambulatory Visit (HOSPITAL_COMMUNITY): Payer: 59 | Admitting: Clinical

## 2021-01-20 LAB — FACTOR 5 LEIDEN

## 2021-01-20 LAB — PROTHROMBIN GENE MUTATION

## 2021-01-22 NOTE — Care Plan (Signed)
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 Called the patient but she didn't pick up the call, left detailed voicemail asking her to either call back or establish and see PCP to discuss results in detail.

## 2021-01-22 NOTE — Plan of Care (Signed)
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  Discussed with the patient that she needs to seek medical attention from her PCP and possibly needs a referral to hematologist.  She verbalized understanding.

## 2021-01-30 LAB — LYME DISEASE DNA BY PCR(BORRELIA BURG): Lyme Disease(B.burgdorferi)PCR: NEGATIVE

## 2021-02-02 LAB — CULTURE, FUNGUS WITHOUT SMEAR

## 2021-02-09 ENCOUNTER — Telehealth (HOSPITAL_COMMUNITY): Payer: Self-pay | Admitting: *Deleted

## 2021-02-09 ENCOUNTER — Other Ambulatory Visit (HOSPITAL_COMMUNITY): Payer: Self-pay | Admitting: Psychiatry

## 2021-02-09 DIAGNOSIS — F900 Attention-deficit hyperactivity disorder, predominantly inattentive type: Secondary | ICD-10-CM

## 2021-02-09 MED ORDER — VYVANSE 50 MG PO CAPS: 50.0000 mg | ORAL_CAPSULE | ORAL | 0 refills | Status: DC

## 2021-02-09 NOTE — Telephone Encounter (Signed)
Thank you :)

## 2021-02-09 NOTE — Telephone Encounter (Signed)
VM from patient requesting her Vyvanse be called in. Per the record she should be out of it. Will notify Dr Ronne Binning of this request.

## 2021-02-09 NOTE — Telephone Encounter (Signed)
Medication refilled and sent to preferred pharmacy

## 2021-02-17 ENCOUNTER — Telehealth (INDEPENDENT_AMBULATORY_CARE_PROVIDER_SITE_OTHER): Payer: 59 | Admitting: Psychiatry

## 2021-02-17 ENCOUNTER — Encounter (HOSPITAL_COMMUNITY): Payer: Self-pay | Admitting: Psychiatry

## 2021-02-17 DIAGNOSIS — F411 Generalized anxiety disorder: Secondary | ICD-10-CM

## 2021-02-17 DIAGNOSIS — F320 Major depressive disorder, single episode, mild: Secondary | ICD-10-CM | POA: Diagnosis not present

## 2021-02-17 DIAGNOSIS — F900 Attention-deficit hyperactivity disorder, predominantly inattentive type: Secondary | ICD-10-CM | POA: Diagnosis not present

## 2021-02-17 DIAGNOSIS — F32A Depression, unspecified: Secondary | ICD-10-CM

## 2021-02-17 MED ORDER — PROPRANOLOL HCL 10 MG PO TABS: 10.0000 mg | ORAL_TABLET | Freq: Three times a day (TID) | ORAL | 3 refills | Status: DC

## 2021-02-17 MED ORDER — QUETIAPINE FUMARATE 100 MG PO TABS: ORAL_TABLET | ORAL | 3 refills | Status: DC

## 2021-02-17 MED ORDER — DULOXETINE HCL 60 MG PO CPEP: 120.0000 mg | ORAL_CAPSULE | ORAL | 3 refills | Status: DC

## 2021-02-17 NOTE — Progress Notes (Signed)
BH MD/PA/NP OP Progress Note Virtual Visit via Video Note  I connected with Mary Kane on 02/17/21 at  9:00 AM EDT by a video enabled telemedicine application and verified that I am speaking with the correct person using two identifiers.  Location: Patient: Home Provider: Clinic   I discussed the limitations of evaluation and management by telemedicine and the availability of in person appointments. The patient expressed understanding and agreed to proceed.  I provided 30 minutes of non-face-to-face time during this encounter.        02/17/2021 9:44 AM Mary Kane  MRN:  [id number]  Chief Complaint: "I has a stoke and have high stress at times but overall ok"  HPI: 30 year old female seen today for follow up psychiatric evaluation. She has a psychiatric history of anxiety, depression, ADHD, borderline personality, and past SI.  She is currently managed on duloxetine 120 mg daily, Seroquel 100 mg at bedtime, propanolol 10 mg three times daily, and Vyvanse 50 mg daily.  She notes that her medications are effective in managing her psychiatric conditions.   Today she was pleasant, cooperative and engaged in conversation. Patient camera was turned off today. Writer asked patient how she was feeling noting that she observed that she was recently hospitalized for a CVA on 01/12/2021-01/13/2021. She notes that at times she is stressed but reports that overall she is doing okay. She notes that she has lots on her plate at work and notes that recently she found out that she has hypertension and high cholesterol. She informed Probation officer that while hospitalized for her CVA she was informed that a potential cause for her CVA was her birth control, smoking, and stress. Provider asked patient if she has stopped smoking and changed other life style habits (exercises and diet). She notes that she continues to smoke but has tried to limit her consumption. She informed Probation officer that at this time she does not  want to start a nicotine patch.   Patient notes that her anxiety and depression has improved since her last visit. She reports that she was skeptical when first stopping xanax but notes that she finds propanolol affective. Provider conducted a GAD 7 and patient scored a 6, at her last visit she scored an 18. Provider also conducted a PHQ 9 and patient scored a 5, at her last visit she scored a 13.  She endorse adequate sleep and appetite. Today she denies SI/HI/VAH, mania, or paranoia.  No medication changes made today. Patient agreeable to continue medications as prescribed. Vyvance not filled today as it was filled on 02/09/2021. No other concerns noted at this time.     Visit Diagnosis:    ICD-10-CM   1. Generalized anxiety disorder  F41.1 DULoxetine (CYMBALTA) 60 MG capsule    propranolol (INDERAL) 10 MG tablet    2. Mild depression (HCC)  F32.0 DULoxetine (CYMBALTA) 60 MG capsule    QUEtiapine (SEROQUEL) 100 MG tablet    3. Attention deficit hyperactivity disorder (ADHD), predominantly inattentive type  F90.0       Past Psychiatric History:  anxiety, depression, ADHD, and past SI  Past Medical History:  Past Medical History:  Diagnosis Date   ADD (attention deficit disorder)    Anxiety    Depression    Dermoid cyst    History of iron deficiency anemia    Insomnia    Mental disorder    Wears contact lenses    Wears glasses     Past Surgical History:  Procedure Laterality Date  LAPAROSCOPIC OVARIAN CYSTECTOMY Right 12/29/2019   Procedure: Laparoscopic right oophorectomy;  Surgeon: Mora Bellman, MD;  Location: [place];  Service: Gynecology;  Laterality: Right;   RHINOPLASTY     WISDOM TOOTH EXTRACTION      Family Psychiatric History: Materal grandmother has mental conditions however notes that she is unaware which. Also notes that cousins have mental heath issues however unaware of which ones.   Family History:  Family History  Problem Relation  Age of Onset   Chromosomal disorder Father    COPD Father    Headache Neg Hx    Migraines Neg Hx     Social History:  Social History   Socioeconomic History   Marital status: Single    Spouse name: Not on file   Number of children: Not on file   Years of education: Not on file   Highest education level: Not on file  Occupational History   Not on file  Tobacco Use   Smoking status: Every Day    Packs/day: 0.40    Years: 2.00    Pack years: 0.80    Types: Cigarettes   Smokeless tobacco: Never  Vaping Use   Vaping Use: Never used  Substance and Sexual Activity   Alcohol use: Yes    Comment: socially   Drug use: Yes    Types: Marijuana    Comment: nightly   Sexual activity: Not Currently    Birth control/protection: Pill  Other Topics Concern   Not on file  Social History Narrative   Lives with family   Right handed   Caffeine: 2-3 cups/day   Social Determinants of Health   Financial Resource Strain: Low Risk    Difficulty of Paying Living Expenses: Not very hard  Food Insecurity: No Food Insecurity   Worried About Charity fundraiser in the Last Year: Never true   Ran Out of Food in the Last Year: Never true  Transportation Needs: No Transportation Needs   Lack of Transportation (Medical): No   Lack of Transportation (Non-Medical): No  Physical Activity: Inactive   Days of Exercise per Week: 0 days   Minutes of Exercise per Session: 0 min  Stress: No Stress Concern Present   Feeling of Stress : Only a little  Social Connections: Socially Isolated   Frequency of Communication with Friends and Family: Twice a week   Frequency of Social Gatherings with Friends and Family: Never   Attends Religious Services: Never   Marine scientist or Organizations: No   Attends Music therapist: Never   Marital Status: Never married    Allergies: No Known Allergies  Metabolic Disorder Labs: Lab Results  Component Value Date   HGBA1C 5.3 01/13/2021    MPG 105.41 01/13/2021   No results found for: PROLACTIN Lab Results  Component Value Date   CHOL 223 (H) 01/13/2021   TRIG 119 01/13/2021   HDL 35 (L) 01/13/2021   CHOLHDL 6.4 01/13/2021   VLDL 24 01/13/2021   LDLCALC 164 (H) 01/13/2021   Lab Results  Component Value Date   TSH 0.820 01/12/2021    Therapeutic Level Labs: No results found for: LITHIUM No results found for: VALPROATE No components found for:  CBMZ  Current Medications: Current Outpatient Medications  Medication Sig Dispense Refill   aspirin EC 81 MG EC tablet Take 1 tablet (81 mg total) by mouth daily. Swallow whole. 30 tablet 11   atorvastatin (LIPITOR) 80 MG tablet Take 1 tablet (  80 mg total) by mouth daily. 30 tablet 0   DULoxetine (CYMBALTA) 60 MG capsule Take 2 capsules (120 mg total) by mouth every morning. 60 capsule 3   norethindrone-ethinyl estradiol (JUNEL FE 1/20) 1-20 MG-MCG tablet Take 1 tablet by mouth daily. 84 tablet 4   propranolol (INDERAL) 10 MG tablet Take 1 tablet (10 mg total) by mouth 3 (three) times daily. 90 tablet 3   QUEtiapine (SEROQUEL) 100 MG tablet TAKE 1 TABLET BY MOUTH EVERYDAY AT BEDTIME 100 tablet 3   VYVANSE 50 MG capsule Take 1 capsule (50 mg total) by mouth every morning. 30 capsule 0   No current facility-administered medications for this visit.     Musculoskeletal: Strength & Muscle Tone:  Unable to assess due to telehealth visit Malcolm:  Unable to assess due to telehealth visit Patient leans: N/A  Psychiatric Specialty Exam: Review of Systems  There were no vitals taken for this visit.There is no height or weight on file to calculate BMI.  General Appearance: Well Groomed  Eye Contact:  Good  Speech:  Clear and Coherent and Normal Rate  Volume:  Normal  Mood:  Anxious and Depressed  Affect:  Appropriate  Thought Process:  Coherent, Goal Directed and Linear  Orientation:  Full (Time, Place, and Person)  Thought Content: WDL and Logical   Suicidal  Thoughts:  Yes.  without intent/plan  Homicidal Thoughts:  No  Memory:  Immediate;   Good Recent;   Good Remote;   Good  Judgement:  Fair  Insight:  Fair  Psychomotor Activity:  Normal  Concentration:  Concentration: Good and Attention Span: Good  Recall:  Good  Fund of Knowledge: Good  Language: Good  Akathisia:  No  Handed:  Right  AIMS (if indicated): Not done  Assets:  Communication Skills Desire for Improvement Financial Resources/Insurance Housing Social Support  ADL's:  Intact  Cognition: WNL  Sleep:  Fair   Screenings: CAGE-AID    Flowsheet Row ED to Hosp-Admission (Discharged) from 01/12/2021 in [place] Score 0      GAD-7    Flowsheet Row Video Visit from 02/17/2021 in [facility] Video Visit from 12/15/2020 in [facility] Video Visit from 09/20/2020 in [facility] Video Visit from 06/21/2020 in [place] from 04/04/2020 in [facility]  Total GAD-7 Score '6 18 12 15 17      '$ PHQ2-9    Flowsheet Row Video Visit from 02/17/2021 in [facility] ED from 12/28/2020 in [facility] Video Visit from 12/15/2020 in [facility] Video Visit from 09/20/2020 in [facility] Counselor from 07/12/2020 in [place]  PHQ-2 Total Score '1 4 3 1 6  '$ PHQ-9 Total Score '5 12 13 9 21      '$ Flowsheet Row ED to Hosp-Admission (Discharged) from 01/12/2021 in [facility] ED from 01/09/2021 in [facility] ED from 12/28/2020 in [place] No Risk No Risk Low Risk        Assessment and Plan: Patient notes improvement in her anxiety and depression.  No medication  changes made today. Patient agreeable to continue medications as prescribed. Vyvance not filled today as it was filled on 02/09/2021. No other concerns noted at this time.  1. Generalized anxiety disorder  Continue- propranolol (INDERAL) 10 MG tablet; Take 1 tablet (10 mg total) by mouth 3 (three) times daily.  Dispense: 90 tablet; Refill: 3 Continue- DULoxetine (CYMBALTA) 60 MG capsule; Take 2 capsules (120 mg total) by mouth every morning.  Dispense: 60 capsule; Refill: 3  2. Mild depression (HCC)   Continue- DULoxetine (CYMBALTA) 60 MG capsule; Take 2 capsules (120 mg total) by mouth every morning.  Dispense: 60 capsule; Refill: 3 Continue- QUEtiapine (SEROQUEL) 100 MG tablet; TAKE 1 TABLET BY MOUTH EVERYDAY AT BEDTIME  Dispense: 100 tablet; Refill: 3  3. Borderline personality disorder ([place])   Follow up in 3 months or as needed Follow up with therapy    Salley Slaughter, NP 02/17/2021, 9:44 AM

## 2021-03-07 ENCOUNTER — Other Ambulatory Visit (HOSPITAL_COMMUNITY)
Admission: RE | Admit: 2021-03-07 | Discharge: 2021-03-07 | Disposition: A | Discharge status: Home / Self Care | Payer: 59 | Source: Ambulatory Visit | Attending: Family Medicine | Admitting: Family Medicine

## 2021-03-07 ENCOUNTER — Other Ambulatory Visit: Payer: Self-pay | Admitting: Family Medicine

## 2021-03-07 DIAGNOSIS — N898 Other specified noninflammatory disorders of vagina: Secondary | ICD-10-CM | POA: Insufficient documentation

## 2021-03-09 ENCOUNTER — Telehealth (HOSPITAL_COMMUNITY): Payer: Self-pay | Admitting: *Deleted

## 2021-03-09 LAB — MOLECULAR ANCILLARY ONLY
Bacterial Vaginitis (gardnerella): NEGATIVE
Candida Glabrata: NEGATIVE
Candida Vaginitis: POSITIVE — AB
Comment: NEGATIVE
Comment: NEGATIVE
Comment: NEGATIVE
Comment: NEGATIVE
Trichomonas: NEGATIVE

## 2021-03-09 NOTE — Telephone Encounter (Signed)
VM from patient re a need for Dr to call her a new rx in for her Vyvanse. Reviewed record and she will be out in the next day or two. Will ask Dr Ronne Binning to e sign in a new rx to her preferred pharmacy.

## 2021-03-10 ENCOUNTER — Telehealth (HOSPITAL_COMMUNITY): Payer: Self-pay | Admitting: *Deleted

## 2021-03-10 ENCOUNTER — Other Ambulatory Visit (HOSPITAL_COMMUNITY): Payer: Self-pay | Admitting: Psychiatry

## 2021-03-10 DIAGNOSIS — F900 Attention-deficit hyperactivity disorder, predominantly inattentive type: Secondary | ICD-10-CM

## 2021-03-10 MED ORDER — VYVANSE 50 MG PO CAPS: 50.0000 mg | ORAL_CAPSULE | ORAL | 0 refills | Status: DC

## 2021-03-10 NOTE — Telephone Encounter (Signed)
Called again today to remind provider she needs her Vyvanse called in. She should be out today or tomorrow. Will forward her request to Dr Ronne Binning.

## 2021-03-10 NOTE — Telephone Encounter (Signed)
Medication refilled and sent to preferred pharmacy

## 2021-03-10 NOTE — Telephone Encounter (Signed)
Opened a second time in error. 

## 2021-04-07 ENCOUNTER — Telehealth (HOSPITAL_COMMUNITY): Payer: Self-pay | Admitting: *Deleted

## 2021-04-07 NOTE — Telephone Encounter (Signed)
Pt called requesting refill VYVANSE 50 MG capsule -sent to [facility]--[address]

## 2021-04-11 ENCOUNTER — Other Ambulatory Visit (HOSPITAL_COMMUNITY): Payer: Self-pay | Admitting: Psychiatry

## 2021-04-11 DIAGNOSIS — F900 Attention-deficit hyperactivity disorder, predominantly inattentive type: Secondary | ICD-10-CM

## 2021-04-11 MED ORDER — VYVANSE 50 MG PO CAPS: 50.0000 mg | ORAL_CAPSULE | ORAL | 0 refills | Status: DC

## 2021-04-11 NOTE — Telephone Encounter (Signed)
Medication refilled and sent to preferred pharmacy

## 2021-04-19 ENCOUNTER — Ambulatory Visit: Payer: 59 | Admitting: Neurology

## 2021-04-19 ENCOUNTER — Telehealth: Payer: Self-pay | Admitting: Neurology

## 2021-04-19 ENCOUNTER — Encounter: Payer: Self-pay | Admitting: Neurology

## 2021-04-19 VITALS — BP 105/68 | HR 100 | Ht 66.0 in | Wt 160.0 lb

## 2021-04-19 DIAGNOSIS — R768 Other specified abnormal immunological findings in serum: Secondary | ICD-10-CM | POA: Diagnosis not present

## 2021-04-19 DIAGNOSIS — D6852 Prothrombin gene mutation: Secondary | ICD-10-CM

## 2021-04-19 DIAGNOSIS — G44209 Tension-type headache, unspecified, not intractable: Secondary | ICD-10-CM | POA: Diagnosis not present

## 2021-04-19 DIAGNOSIS — I639 Cerebral infarction, unspecified: Secondary | ICD-10-CM

## 2021-04-19 NOTE — Progress Notes (Signed)
[facility] [address] [place]. [place] [address] [phone]       OFFICE CONSULT NOTE  [name] Date of Birth:  May 04, 1991 Medical Record Number:  [id number]   Referring MD: Mary Kane  Reason for Referral: Stroke  HPI: [name] is a pleasant 30 year old Caucasian lady seen today for office consultation visit for stroke.  History is obtained from the patient and review of electronic medical records and I personally reviewed pertinent available imaging films in PACS.  Patient has past medical history of anxiety, depression, ADD and insomnia.  She presented in July 2022 with 2-week history of new onset of headaches mostly on the right side on the top with very minimum occasional flashing lights but no associated photophobia or nausea.  She saw Dr. Arnell Sieving in office on 01/12/2021 who ordered an MRI scan of the brain which was done that day and showed multiple punctate foci of restricted diffusion in the right occipital and parietal and frontal cortex without corresponding dark signal on the ADC and there were possible remote lacunar infarcts in the right cerebellum and right caudate as well.  Patient was admitted and underwent a spinal tap which was unremarkable.  She underwent extensive lab work for autoimmune and inflammatory and vasculitic etiologies.  ANA was positive however ESR was only 8 mm and ANCA antibodies were negative.  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  Lupus anticoagulant was negative.  Anticardiolipin antibodies were negative.  Protein S level was borderline low at 57.  RPR, HIV and Lyme titers were all negative.  Patient has no family history of strokes at a young age.  No history of DVT, pulmonary embolism or recurrent miscarriages.  Patient was advised to undergo TEE as outpatient but apparently has not been done.  Patient had a TCD bubble study in the hospital which was  negative for PFO and right-to-left shunt.  Patient states that her headaches have improved they are not as bad though occasionally she has had pain off and on.  She describes this as a mild 2/10 severity headache and does not need to take any medicines for it.  Dizziness seems to have improved 2.  She has been started on aspirin 81 mg daily as well as Lipitor 80 mg and she is tolerating both medications well without bruising, bleeding or muscle aches or pains.  She does have occasional symptoms of orthostatic dizziness and this has been noticed in the last 1 week when she is started on Inderal.  She continues to smoke though she has cut back.  She has stopped her birth control pills.  She has no new complaints today.  She denies history of active rheumatological illness in the form of arthralgias, myalgias or rash even though her ANA is positive.  ROS:   14 system review of systems is positive for headache, dizziness only and all other systems negative  PMH:  Past Medical History:  Diagnosis Date   ADD (attention deficit disorder)    Anxiety    Depression    Dermoid cyst    History of iron deficiency anemia    Insomnia    Mental disorder    Wears contact lenses    Wears glasses     Social History:  Social History   Socioeconomic History   Marital status: Single    Spouse name: Not on file   Number of children: Not on file   Years of education:  Not on file   Highest education level: Not on file  Occupational History   Not on file  Tobacco Use   Smoking status: Every Day    Packs/day: 0.40    Years: 2.00    Pack years: 0.80    Types: Cigarettes   Smokeless tobacco: Never  Vaping Use   Vaping Use: Never used  Substance and Sexual Activity   Alcohol use: Yes    Comment: socially   Drug use: Yes    Types: Marijuana    Comment: nightly   Sexual activity: Not Currently    Birth control/protection: Pill  Other Topics Concern   Not on file  Social History Narrative   Lives  with family   Right handed   Caffeine: 2-3 cups/day   Social Determinants of Health   Financial Resource Strain: Low Risk    Difficulty of Paying Living Expenses: Not very hard  Food Insecurity: No Food Insecurity   Worried About Charity fundraiser in the Last Year: Never true   Ran Out of Food in the Last Year: Never true  Transportation Needs: No Transportation Needs   Lack of Transportation (Medical): No   Lack of Transportation (Non-Medical): No  Physical Activity: Inactive   Days of Exercise per Week: 0 days   Minutes of Exercise per Session: 0 min  Stress: No Stress Concern Present   Feeling of Stress : Only a little  Social Connections: Socially Isolated   Frequency of Communication with Friends and Family: Twice a week   Frequency of Social Gatherings with Friends and Family: Never   Attends Religious Services: Never   Printmaker: No   Attends Music therapist: Never   Marital Status: Never married  Human resources officer Violence: Not At Risk   Fear of Current or Ex-Partner: No   Emotionally Abused: No   Physically Abused: No   Sexually Abused: No    Medications:   Current Outpatient Medications on File Prior to Visit  Medication Sig Dispense Refill   amLODipine (NORVASC) 2.5 MG tablet SMARTSIG:1 Tablet(s) By Mouth Every Evening     aspirin EC 81 MG EC tablet Take 1 tablet (81 mg total) by mouth daily. Swallow whole. 30 tablet 11   DULoxetine (CYMBALTA) 60 MG capsule Take 2 capsules (120 mg total) by mouth every morning. 60 capsule 3   propranolol (INDERAL) 10 MG tablet Take 1 tablet (10 mg total) by mouth 3 (three) times daily. 90 tablet 3   QUEtiapine (SEROQUEL) 100 MG tablet TAKE 1 TABLET BY MOUTH EVERYDAY AT BEDTIME 100 tablet 3   valsartan-hydrochlorothiazide (DIOVAN-HCT) 160-12.5 MG tablet SMARTSIG:1 Pill By Mouth Every Morning     VYVANSE 50 MG capsule Take 1 capsule (50 mg total) by mouth every morning. 30 capsule 0    atorvastatin (LIPITOR) 80 MG tablet Take 1 tablet (80 mg total) by mouth daily. 30 tablet 0   No current facility-administered medications on file prior to visit.    Allergies:  No Known Allergies  Physical Exam General: Frail pleasant young Caucasian lady, seated, in no evident distress Head: head normocephalic and atraumatic.   Neck: supple with no carotid or supraclavicular bruits Cardiovascular: regular rate and rhythm, no murmurs Musculoskeletal: no deformity Skin:  no rash/petichiae Vascular:  Normal pulses all extremities  Neurologic Exam Mental Status: Awake and fully alert. Oriented to place and time. Recent and remote memory intact. Attention span, concentration and fund of knowledge appropriate. Mood and  affect appropriate.  Cranial Nerves: Fundoscopic exam reveals sharp disc margins. Pupils equal, briskly reactive to light. Extraocular movements full without nystagmus. Visual fields full to confrontation. Hearing intact. Facial sensation intact. Face, tongue, palate moves normally and symmetrically.  Motor: Normal bulk and tone. Normal strength in all tested extremity muscles. Sensory.: intact to touch , pinprick , position and vibratory sensation.  Coordination: Rapid alternating movements normal in all extremities. Finger-to-nose and heel-to-shin performed accurately bilaterally. Gait and Station: Arises from chair without difficulty. Stance is normal. Gait demonstrates normal stride length and balance . Able to heel, toe and tandem walk without difficulty.  Reflexes: 1+ and symmetric. Toes downgoing.   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  I recommend she continue aspirin 81 mg daily for stroke prevention and discontinue birth control pills and quit smoking cigarettes and continue Lipitor with aggressive lipid control with LDL goal below 70 mg percent.  I recommend she increase participation in stress laxation activities like meditation and yoga and exercise as well as do regular neck stretching exercises for her tension headaches.  She was also advised to see a primary care physician to discuss decreasing and stopping Inderal since she is having orthostatic symptoms since starting it.  Repeat MRI scan of the brain with and without contrast to look for any interval new brain lesions as well as check TEE for cardiac source of embolism.  Referred to rheumatology for a positive ANA though she lacks typical clinical symptoms of lupus.  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  She will return for follow-up in the future in 3 months or call earlier if necessary.  Greater than 50% time during this 45-minute visit was spent on counseling and coordination of care about subjective complaints of headache and dizziness as well as abnormal MRI discussion about lab results and answering questions Antony Contras, MD  Note: This document was prepared with digital dictation and possible smart phrase technology. Any transcriptional errors that result from this process are unintentional.

## 2021-04-19 NOTE — Patient Instructions (Signed)
I had a long discussion with the patient with regards to his subacute symptoms of headache and dizziness and discussed results of MRI as well as lab results and answered questions.  I recommend she continue aspirin 81 mg daily for stroke prevention and discontinue birth control pills and quit smoking cigarettes and continue Lipitor with aggressive lipid control with LDL goal below 70 mg percent.  I recommend she increase participation in stress laxation activities like meditation and yoga and exercise as well as do regular neck stretching exercises for her tension headaches.  She was also advised to see a primary care physician to discuss decreasing and stopping Inderal since she is having orthostatic symptoms since starting it.  Repeat MRI scan of the brain with and without contrast to look for any interval new brain lesions as well as check TEE for cardiac source of embolism.  Referred to rheumatology for a positive ANA though she lacks typical clinical symptoms of lupus.  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  She will return for follow-up in the future in 3 months or call earlier if necessary.

## 2021-04-19 NOTE — Telephone Encounter (Signed)
[employer] auth: [id number] (exp. 04/19/21 to 05/18/21) order sent to GI, they will reach out to the patient to schedule.

## 2021-04-26 ENCOUNTER — Telehealth (HOSPITAL_COMMUNITY): Payer: Self-pay | Admitting: *Deleted

## 2021-04-26 NOTE — Telephone Encounter (Signed)
Provider attempted to call patient without success.  Provider did inform patient that the medication was recently filled on 04/11/2021 and at this time cannot be refilled.  Provider left a message informing patient of this and instructed her to call the clinic back when her medications is due for further refills.

## 2021-04-26 NOTE — Telephone Encounter (Signed)
VM states she is moving out of state next week and is asking for a "vacation dose" of her Vyvanse to last her till she comes home for Christmas. States she cant change her insurance right away because she has specialist appts. I will pass this request on to her provider.

## 2021-04-28 ENCOUNTER — Other Ambulatory Visit (HOSPITAL_COMMUNITY): Payer: Self-pay | Admitting: Psychiatry

## 2021-04-28 ENCOUNTER — Telehealth (HOSPITAL_COMMUNITY): Payer: Self-pay | Admitting: *Deleted

## 2021-04-28 DIAGNOSIS — F900 Attention-deficit hyperactivity disorder, predominantly inattentive type: Secondary | ICD-10-CM

## 2021-04-28 MED ORDER — VYVANSE 50 MG PO CAPS: 50.0000 mg | ORAL_CAPSULE | ORAL | 0 refills | Status: DC

## 2021-04-28 NOTE — Telephone Encounter (Signed)
Medication refilled however the pharmacy may not release the prescription as it is a week and a half early.

## 2021-04-28 NOTE — Telephone Encounter (Signed)
Call again from Mary Kane wanting a "vacation dose" of her Vyvanse. She is leaving for [place] on Monday, this will be a permanent move. She states she has lived there before. She will be home to [place] at [place]. She is not wanting to change her insurance and her address till the first of the year because she has several medical appts in the next two months and doesn't want them backed up while she waits to have her new insurance kick in. She states is Mary Kane escribes her med to her her mom can send them to her. Told her once she moves she will no longer be our patient but will ask Mary Kane if she is willing to do one more rx.

## 2021-04-29 ENCOUNTER — Other Ambulatory Visit (HOSPITAL_COMMUNITY): Payer: Self-pay | Admitting: Psychiatry

## 2021-04-29 DIAGNOSIS — F411 Generalized anxiety disorder: Secondary | ICD-10-CM

## 2021-04-29 DIAGNOSIS — F32A Depression, unspecified: Secondary | ICD-10-CM

## 2021-05-03 ENCOUNTER — Telehealth (HOSPITAL_COMMUNITY): Payer: Self-pay | Admitting: *Deleted

## 2021-05-03 NOTE — Telephone Encounter (Signed)
Called in inquire if I had gotten her message re her Vyvanse rx needing the dr to tell the pharmacy it can be released early. Dr Ronne Binning was given that information but chose for it not to be early released and one conversation I had with Mary Kane she indicated her mom could mail it to her. She called today to see what was done and I informed her the provider chose for it not to be released early. She responded she wished someone would of let her know that so she didn't spend time with multiple phone calls. She then called back a short time later and left a message that she had been crying since getting off the phone with me because I was rude to her. I dont feel I was rude and will not be returning the call.

## 2021-05-13 ENCOUNTER — Other Ambulatory Visit (HOSPITAL_COMMUNITY): Payer: Self-pay | Admitting: Psychiatry

## 2021-05-13 DIAGNOSIS — F17200 Nicotine dependence, unspecified, uncomplicated: Secondary | ICD-10-CM

## 2021-05-25 ENCOUNTER — Encounter (HOSPITAL_COMMUNITY): Payer: Self-pay | Admitting: Psychiatry

## 2021-05-25 ENCOUNTER — Telehealth (INDEPENDENT_AMBULATORY_CARE_PROVIDER_SITE_OTHER): Payer: 59 | Admitting: Psychiatry

## 2021-05-25 DIAGNOSIS — F32A Depression, unspecified: Secondary | ICD-10-CM

## 2021-05-25 DIAGNOSIS — F411 Generalized anxiety disorder: Secondary | ICD-10-CM | POA: Diagnosis not present

## 2021-05-25 MED ORDER — PROPRANOLOL HCL 10 MG PO TABS: 10.0000 mg | ORAL_TABLET | Freq: Three times a day (TID) | ORAL | 1 refills | Status: AC

## 2021-05-25 MED ORDER — QUETIAPINE FUMARATE 100 MG PO TABS
ORAL_TABLET | ORAL | 3 refills | Status: AC
Start: 2021-05-25 — End: ?

## 2021-05-25 MED ORDER — DULOXETINE HCL 60 MG PO CPEP: 120.0000 mg | ORAL_CAPSULE | ORAL | 3 refills | Status: AC

## 2021-05-25 NOTE — Progress Notes (Signed)
[facility] MD/PA/NP OP Progress Note Virtual Visit via Telephone Note  I connected with Mary Kane on 05/25/21 at  2:30 PM EST by telephone and verified that I am speaking with the correct person using two identifiers.  Location: Patient: home Provider: Clinic   I discussed the limitations, risks, security and privacy concerns of performing an evaluation and management service by telephone and the availability of in person appointments. I also discussed with the patient that there may be a patient responsible charge related to this service. The patient expressed understanding and agreed to proceed.   I provided 30 minutes of non-face-to-face time during this encounter.         05/25/2021 2:49 PM Mary Kane  MRN:  [id number]  Chief Complaint: "I recently moved to [place]"  HPI: 30 year old female seen today for follow up psychiatric evaluation. She has a psychiatric history of anxiety, depression, ADHD, borderline personality, and past SI.  She is currently managed on duloxetine 120 mg daily, Seroquel 100 mg at bedtime, propanolol 10 mg three times daily, and Vyvanse 50 mg daily.  She notes that her medications are effective in managing her psychiatric conditions.   Today  patient was unable to login virtually so her assessment was done over the phone. She notes that recently she moved to [place] and has been having some adjustments issues. She reports that her apartment had roaches and once had bed bugs. She notes that things within her apartment has improved and she is feeling better. 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 Recently patient notes that she believes she has Autism but fears being tested due to having a stigma.   Patient notes that her she is "getting in the swing" of things in [place] and notes that her anxiety and depression are manageable. At this time patient request not to da a GAD 7 or a PHQ 9.  She endorses adequate sleep and appetite. Today she denies SI/HI/VAH, mania, or paranoia.  No medication changes made today. Patient agreeable to continue medications as prescribed. Vyvance not filled today as it was recently filled. Patient notes that she will have [employer] Care in [place] and plans to find a new provider. No other concerns noted at this time.     Visit Diagnosis:    ICD-10-CM   1. Generalized anxiety disorder  F41.1 DULoxetine (CYMBALTA) 60 MG capsule    propranolol (INDERAL) 10 MG tablet    2. Mild depression  F32.A DULoxetine (CYMBALTA) 60 MG capsule    QUEtiapine (SEROQUEL) 100 MG tablet      Past Psychiatric History:  anxiety, depression, ADHD, and past SI  Past Medical History:  Past Medical History:  Diagnosis Date   ADD (attention deficit disorder)    Anxiety    Depression    Dermoid cyst    History of iron deficiency anemia    Insomnia    Mental disorder    Wears contact lenses    Wears glasses     Past Surgical History:  Procedure Laterality Date   LAPAROSCOPIC OVARIAN CYSTECTOMY Right 12/29/2019   Procedure: Laparoscopic right oophorectomy;  Surgeon: Mora Bellman, MD;  Location: [facility];  Service: Gynecology;  Laterality: Right;   RHINOPLASTY     WISDOM TOOTH EXTRACTION      Family Psychiatric History: Materal grandmother has mental conditions however notes that she is unaware which. Also notes that cousins  have mental heath issues however unaware of which ones.   Family History:  Family History  Problem Relation Age of Onset   Chromosomal disorder Father    COPD Father    Headache Neg Hx    Migraines Neg Hx     Social History:  Social History   Socioeconomic History   Marital status: Single    Spouse name: Not on file   Number of children: Not on file   Years of education: Not on file   Highest education level: Not on file  Occupational History   Not on file  Tobacco Use   Smoking status: Every Day     Packs/day: 0.40    Years: 2.00    Pack years: 0.80    Types: Cigarettes   Smokeless tobacco: Never  Vaping Use   Vaping Use: Never used  Substance and Sexual Activity   Alcohol use: Yes    Comment: socially   Drug use: Yes    Types: Marijuana    Comment: nightly   Sexual activity: Not Currently    Birth control/protection: Pill  Other Topics Concern   Not on file  Social History Narrative   Lives with family   Right handed   Caffeine: 2-3 cups/day   Social Determinants of Health   Financial Resource Strain: Low Risk    Difficulty of Paying Living Expenses: Not very hard  Food Insecurity: No Food Insecurity   Worried About Charity fundraiser in the Last Year: Never true   Ran Out of Food in the Last Year: Never true  Transportation Needs: No Transportation Needs   Lack of Transportation (Medical): No   Lack of Transportation (Non-Medical): No  Physical Activity: Inactive   Days of Exercise per Week: 0 days   Minutes of Exercise per Session: 0 min  Stress: No Stress Concern Present   Feeling of Stress : Only a little  Social Connections: Socially Isolated   Frequency of Communication with Friends and Family: Twice a week   Frequency of Social Gatherings with Friends and Family: Never   Attends Religious Services: Never   Marine scientist or Organizations: No   Attends Music therapist: Never   Marital Status: Never married    Allergies: No Known Allergies  Metabolic Disorder Labs: Lab Results  Component Value Date   HGBA1C 5.3 01/13/2021   MPG 105.41 01/13/2021   No results found for: PROLACTIN Lab Results  Component Value Date   CHOL 223 (H) 01/13/2021   TRIG 119 01/13/2021   HDL 35 (L) 01/13/2021   CHOLHDL 6.4 01/13/2021   VLDL 24 01/13/2021   LDLCALC 164 (H) 01/13/2021   Lab Results  Component Value Date   TSH 0.820 01/12/2021    Therapeutic Level Labs: No results found for: LITHIUM No results found for: VALPROATE No  components found for:  CBMZ  Current Medications: Current Outpatient Medications  Medication Sig Dispense Refill   amLODipine (NORVASC) 2.5 MG tablet SMARTSIG:1 Tablet(s) By Mouth Every Evening     aspirin EC 81 MG EC tablet Take 1 tablet (81 mg total) by mouth daily. Swallow whole. 30 tablet 11   atorvastatin (LIPITOR) 80 MG tablet Take 1 tablet (80 mg total) by mouth daily. 30 tablet 0   DULoxetine (CYMBALTA) 60 MG capsule Take 2 capsules (120 mg total) by mouth every morning. 60 capsule 3   propranolol (INDERAL) 10 MG tablet Take 1 tablet (10 mg total) by mouth 3 (three) times  daily. 270 tablet 1   QUEtiapine (SEROQUEL) 100 MG tablet TAKE 1 TABLET BY MOUTH EVERYDAY AT BEDTIME 100 tablet 3   valsartan-hydrochlorothiazide (DIOVAN-HCT) 160-12.5 MG tablet SMARTSIG:1 Pill By Mouth Every Morning     VYVANSE 50 MG capsule Take 1 capsule (50 mg total) by mouth every morning. 30 capsule 0   No current facility-administered medications for this visit.     Musculoskeletal: Strength & Muscle Tone:  Unable to assess due to telephone visit Gait & Station:  Unable to assess due to telephone visit Patient leans: N/A  Psychiatric Specialty Exam: Review of Systems  There were no vitals taken for this visit.There is no height or weight on file to calculate BMI.  General Appearance:  Unable to assess due to telephone visit  Eye Contact:   Unable to assess due to telephone visit  Speech:  Clear and Coherent and Normal Rate  Volume:  Normal  Mood:  Euthymic  Affect:  Appropriate  Thought Process:  Coherent, Goal Directed and Linear  Orientation:  Full (Time, Place, and Person)  Thought Content: WDL and Logical   Suicidal Thoughts:  No  Homicidal Thoughts:  No  Memory:  Immediate;   Good Recent;   Good Remote;   Good  Judgement:  Good  Insight:  Good  Psychomotor Activity:   Unable to assess due to telephone visit  Concentration:  Concentration: Good and Attention Span: Good  Recall:  Good   Fund of Knowledge: Good  Language: Good  Akathisia:   Unable to assess due to telephone visit  Handed:  Right  AIMS (if indicated): Not done  Assets:  Communication Skills Desire for Improvement Financial Resources/Insurance Housing Social Support  ADL's:  Intact  Cognition: WNL  Sleep:  Good   Screenings: CAGE-AID    Flowsheet Row ED to Hosp-Admission (Discharged) from 01/12/2021 in [facility] Score 0      GAD-7    Flowsheet Row Video Visit from 02/17/2021 in [facility] Video Visit from 12/15/2020 in [facility] Video Visit from 09/20/2020 in [facility] Video Visit from 06/21/2020 in [facility] from 04/04/2020 in [facility]  Total GAD-7 Score 6 18 12 15 17       PHQ2-9    Flowsheet Row Video Visit from 02/17/2021 in [facility] ED from 12/28/2020 in [facility] Video Visit from 12/15/2020 in [facility] Video Visit from 09/20/2020 in [facility] from 07/12/2020 in [facility]  PHQ-2 Total Score 1 4 3 1 6   PHQ-9 Total Score 5 12 13 9 21       Flowsheet Row ED to Hosp-Admission (Discharged) from 01/12/2021 in [facility] ED from 01/09/2021 in [facility] DEPT ED from 12/28/2020 in [facility] [facility] No Risk No Risk Low Risk        Assessment and Plan: Patient notes improvement in her mood, sleep, anxiety and depression.  No medication changes made today. Patient agreeable to continue medications as prescribed. Vyvance not filled today as it was recently filled. Patient notes that she will have [employer] Care in [place] and plans to find a new provider   1.  Generalized anxiety disorder  Continue- DULoxetine (CYMBALTA) 60 MG capsule; Take 2 capsules (120 mg  total) by mouth every morning.  Dispense: 60 capsule; Refill: 3 Continue- propranolol (INDERAL) 10 MG tablet; Take 1 tablet (10 mg total) by mouth 3 (three) times daily.  Dispense: 270 tablet; Refill: 1  2. Mild depression  Continue- DULoxetine (CYMBALTA) 60 MG capsule; Take 2 capsules (120 mg total) by mouth every morning.  Dispense: 60 capsule; Refill: 3 Continue- QUEtiapine (SEROQUEL) 100 MG tablet; TAKE 1 TABLET BY MOUTH EVERYDAY AT BEDTIME  Dispense: 100 tablet; Refill: 3  Follow up  as needed Follow up with therapy    Salley Slaughter, NP 05/25/2021, 2:49 PM

## 2021-06-05 ENCOUNTER — Telehealth (HOSPITAL_COMMUNITY): Payer: Self-pay

## 2021-06-05 NOTE — Telephone Encounter (Signed)
Patient called requesting a refill on her Vyvanse 50mg  . It was sent to [facility] on [address] in [place] last time. Please review and advise. Thank you

## 2021-06-06 ENCOUNTER — Other Ambulatory Visit (HOSPITAL_COMMUNITY): Payer: Self-pay | Admitting: Psychiatry

## 2021-06-06 DIAGNOSIS — F900 Attention-deficit hyperactivity disorder, predominantly inattentive type: Secondary | ICD-10-CM

## 2021-06-06 MED ORDER — VYVANSE 50 MG PO CAPS
50.0000 mg | ORAL_CAPSULE | ORAL | 0 refills | Status: AC
Start: 2021-06-06 — End: ?

## 2021-06-06 NOTE — Telephone Encounter (Signed)
Medication refilled and sent to preferred pharmacy

## 2021-06-07 ENCOUNTER — Other Ambulatory Visit: Payer: Self-pay

## 2021-06-07 ENCOUNTER — Ambulatory Visit
Admission: RE | Admit: 2021-06-07 | Discharge: 2021-06-07 | Disposition: A | Discharge status: Home / Self Care | Payer: 59 | Source: Ambulatory Visit | Attending: Neurology | Admitting: Neurology

## 2021-06-07 DIAGNOSIS — I639 Cerebral infarction, unspecified: Secondary | ICD-10-CM

## 2021-06-07 MED ORDER — GADOBENATE DIMEGLUMINE 529 MG/ML IV SOLN
14.0000 mL | Freq: Once | INTRAVENOUS | Status: AC | PRN
  Administered 2021-06-07: 14:00:00 14 mL via INTRAVENOUS

## 2021-06-14 ENCOUNTER — Telehealth: Payer: Self-pay | Admitting: *Deleted

## 2021-06-14 NOTE — Telephone Encounter (Signed)
LMVM for pt that left message on mychart re: MRI results.  She may also call back if questions.

## 2021-06-15 NOTE — Telephone Encounter (Signed)
NOTIFIED PATIENT - LVM

## 2021-07-11 ENCOUNTER — Telehealth (HOSPITAL_COMMUNITY): Payer: Self-pay | Admitting: *Deleted

## 2021-07-11 NOTE — Telephone Encounter (Signed)
Patient called and is now living out of state, she is asking for a rx till she sees someone in her new state in Feb. She was told by my peer yesterday we could not help her as she was now out of state

## 2021-08-23 ENCOUNTER — Ambulatory Visit: Payer: 59 | Admitting: Neurology

## 2022-07-12 ENCOUNTER — Other Ambulatory Visit (HOSPITAL_COMMUNITY): Payer: Self-pay | Admitting: Psychiatry

## 2022-07-12 DIAGNOSIS — F411 Generalized anxiety disorder: Secondary | ICD-10-CM

## 2022-12-23 IMAGING — CT CT ANGIO HEAD
1 of 10 series · 6 of 34 positions shown · IV contrast (omnipaque)
Comparison: MRI same day.  Head CT 01/09/2021.

CLINICAL DATA: Neurological deficit, acute, stroke suspected.
Headache over the last 10 days.

EXAM:
CT ANGIOGRAPHY HEAD AND NECK
TECHNIQUE: Multidetector CT imaging of the head and neck was performed using
the standard protocol during bolus administration of intravenous
contrast. Multiplanar CT image reconstructions and MIPs were
obtained to evaluate the vascular anatomy. Carotid stenosis
measurements (when applicable) are obtained utilizing NASCET
criteria, using the distal internal carotid diameter as the
denominator.
CONTRAST:  75mL OMNIPAQUE IOHEXOL 350 MG/ML SOLN

[Series 10: ax thin · axial · 0.39mm/px · z∈[-279,-16]mm · 6 of 369 slices shown]
[im 53/369  soft-tissue]
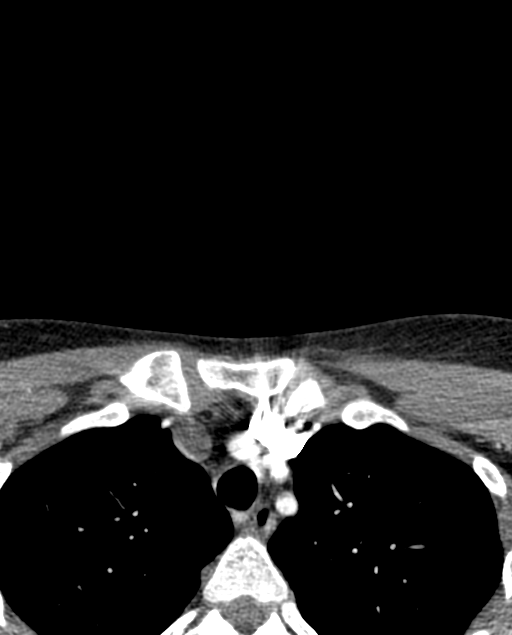
[im 106/369  bone]
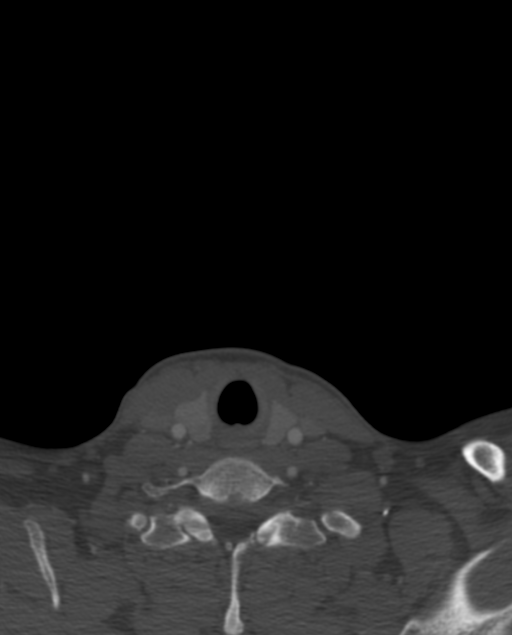
[im 158/369  soft-tissue]
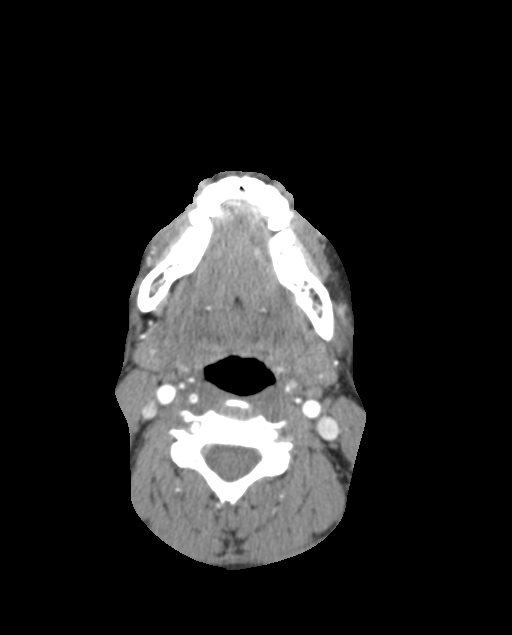
[im 211/369  bone]
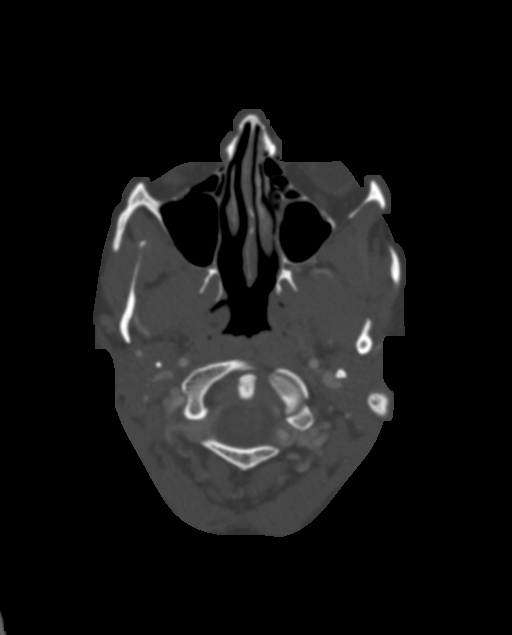
[im 263/369  soft-tissue]
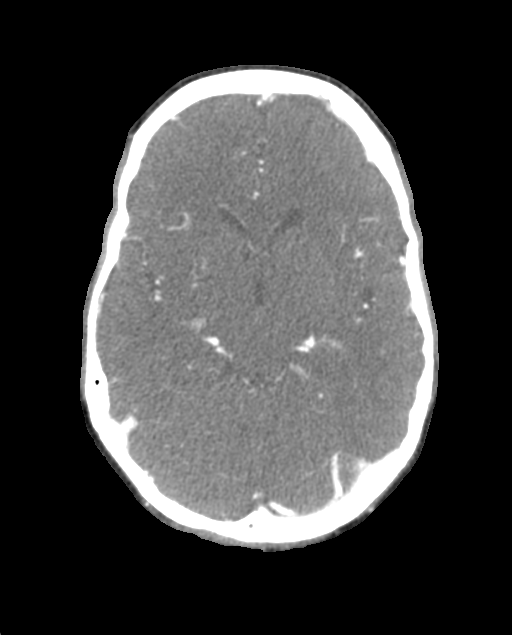
[im 316/369  bone]
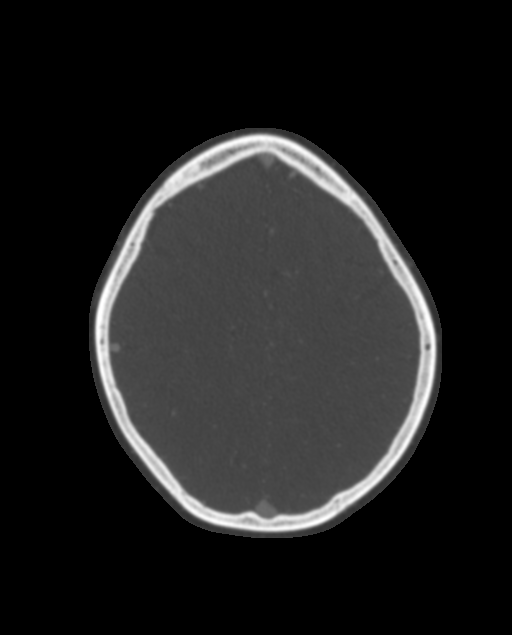

[6 of 34 positions shown; findings below may reference images not displayed]

FINDINGS: CT HEAD

Brain: The brain remains normal by CT. No sign of old or acute
infarction, mass lesion, hemorrhage, hydrocephalus or extra-axial
collection. Punctate acute infarctions shown by MRI would likely be
inapparent.

Vascular: No abnormal vascular finding.

Skull: Normal

Sinuses: Clear/normal

Other: None

CTA NECK FINDINGS

Aortic arch: Aortic arch is normal.  Branching pattern is normal.

Right carotid system: Common carotid artery widely patent to the
bifurcation. Carotid bifurcation is normal. Cervical ICA is normal.
No sign of dissection.

Left carotid system: Common carotid artery widely patent to the
bifurcation. Carotid bifurcation is normal. Cervical ICA is normal.
No sign of dissection.

Vertebral arteries: Both vertebral artery origins are normal. Both
vertebral arteries are normal through the cervical region to the
foramen magnum.

Skeleton: Normal

Other neck: No mass or lymphadenopathy.

Upper chest: Normal

Review of the MIP images confirms the above findings

CTA HEAD FINDINGS

Anterior circulation: Both internal carotid arteries are widely
patent through the skull base and siphon regions. The anterior and
middle cerebral vessels are normal. No large vessel occlusion. No
proximal stenosis. No aneurysm or vascular malformation.

Posterior circulation: Both vertebral arteries widely patent through
the foramen magnum to the basilar. No basilar stenosis. Posterior
circulation branch vessels are normal.

Venous sinuses: Patent and normal.

Anatomic variants: None significant.

Review of the MIP images confirms the above findings
IMPRESSION: Head CT remains normal.

Normal CT angiography of the neck and head. No large vessel
occlusion. No evidence of aortic arch pathology. No evidence of
dissection in the neck.

## 2022-12-23 IMAGING — MR MR HEAD WO/W CM
13 series · 48 of 48 positions shown · IV contrast (gadavist)
Comparison: CT head January 09, 2021.

CLINICAL DATA: Headaches.  Dizziness and blurred vision.

EXAM:
MRI HEAD WITHOUT AND WITH CONTRAST
TECHNIQUE: Multiplanar, multiecho pulse sequences of the brain and surrounding
structures were obtained without and with intravenous contrast.
CONTRAST:  7mL GADAVIST GADOBUTROL 1 MMOL/ML IV SOLN

[Series 5: DWI · axial · 3.0mm · 1.36mm/px · z∈[-105,+54]mm · 6 of 108 slices shown (1 of 2)]
[im 1/108]
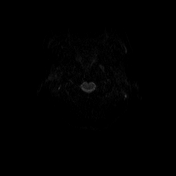
[im 22/108]
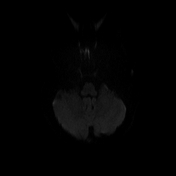
[im 43/108]
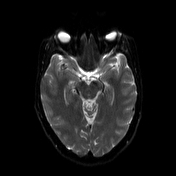
[im 65/108]
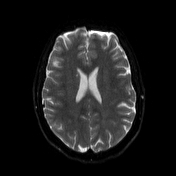
[im 86/108]
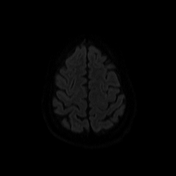
[im 108/108]
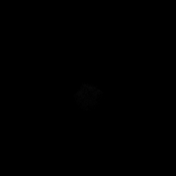

[Series 6: DWI · axial · 3.0mm · 1.36mm/px · z∈[-105,+54]mm · 3 of 54 slices shown (2 of 2)]
[im 1/54]
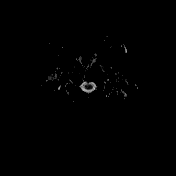
[im 27/54]
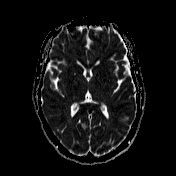
[im 54/54]
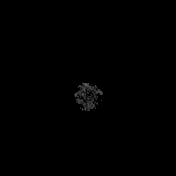

[Series 7: T1 · sagittal · 5.0mm · 0.75mm/px · 1 of 24 slices shown (1 of 2)]
[im 1/24]
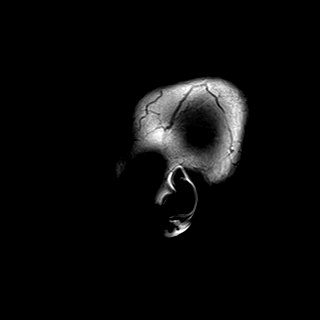

[Series 8: T2 · axial · 5.0mm · 0.62mm/px · z∈[-105,+57]mm · 2 of 26 slices shown]
[im 1/26]
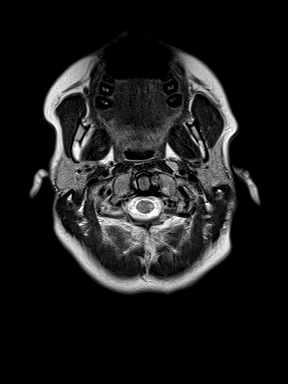
[im 26/26]
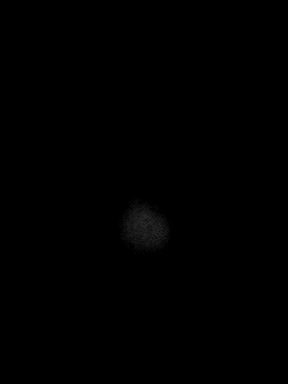

[Series 9: swi_images · axial · 3.0mm · 0.75mm/px · z∈[-107,+58]mm · 3 of 56 slices shown]
[im 1/56]
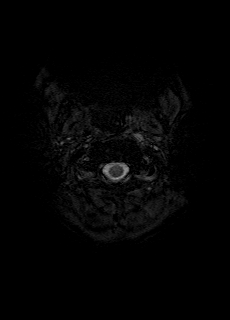
[im 28/56]
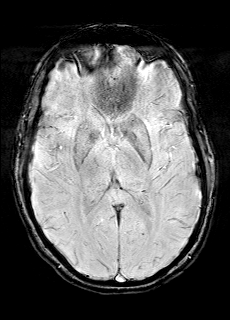
[im 56/56]
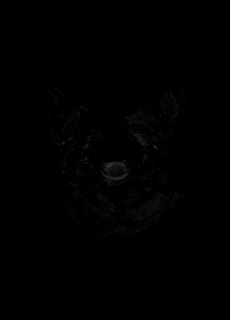

[Series 11: FLAIR · axial · 3.0mm · 0.75mm/px · z∈[-101,+52]mm · 3 of 52 slices shown]
[im 1/52]
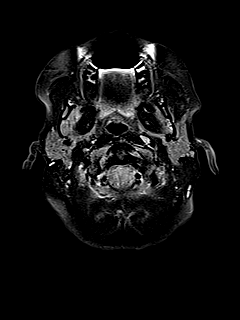
[im 26/52]
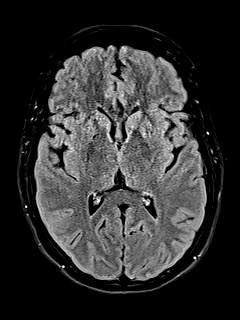
[im 52/52]
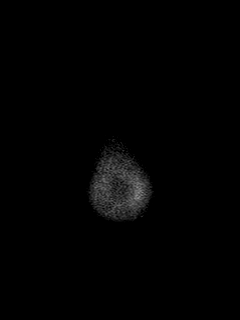

[Series 12: T1 · axial · 1.0mm · 0.94mm/px · z∈[-104,+55]mm · 10 of 160 slices shown (2 of 2)]
[im 1/160]
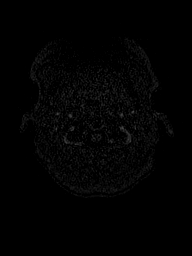
[im 18/160]
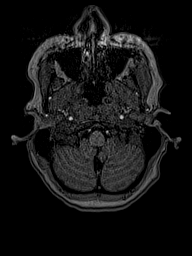
[im 36/160]
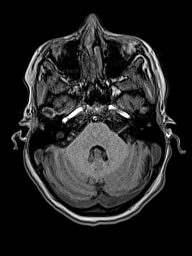
[im 54/160]
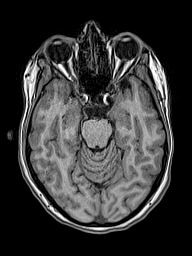
[im 71/160]
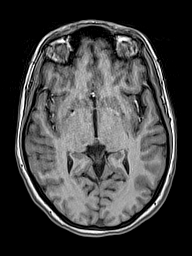
[im 89/160]
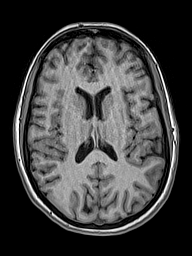
[im 107/160]
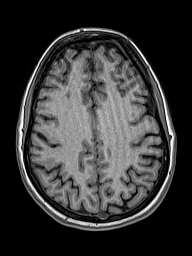
[im 124/160]
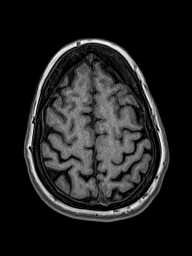
[im 142/160]
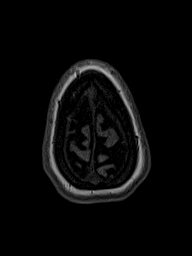
[im 160/160]
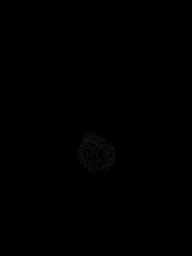

[Series 13: cor dwi_tracew · coronal · 5.0mm · 1.53mm/px · 3 of 54 slices shown]
[im 1/54]
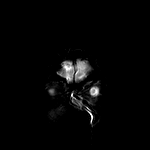
[im 27/54]
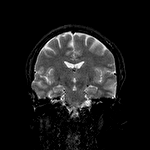
[im 54/54]
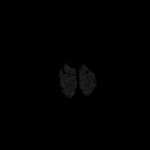

[Series 14: cor dwi_adc · coronal · 5.0mm · 1.53mm/px · 2 of 27 slices shown]
[im 1/27]
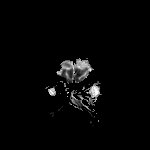
[im 27/27]
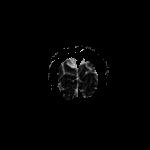

[Series 15: T2 post-contrast · coronal · 5.0mm · 0.57mm/px · 2 of 30 slices shown]
[im 1/30]
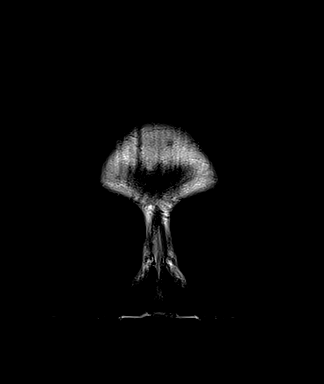
[im 30/30]
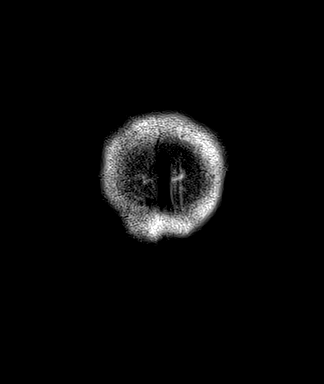

[Series 16: T1 post-contrast · axial · 1.0mm · 0.94mm/px · z∈[-104,+55]mm · 10 of 160 slices shown (1 of 3)]
[im 1/160]
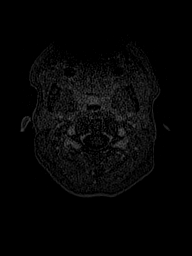
[im 18/160]
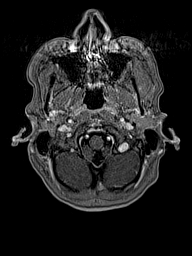
[im 36/160]
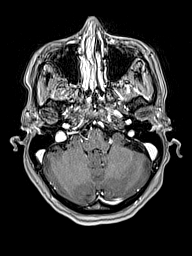
[im 54/160]
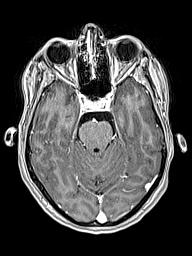
[im 71/160]
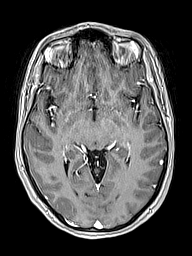
[im 89/160]
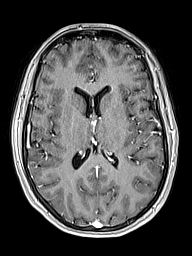
[im 107/160]
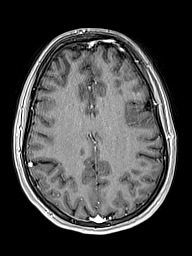
[im 124/160]
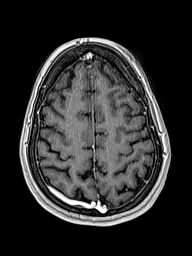
[im 142/160]
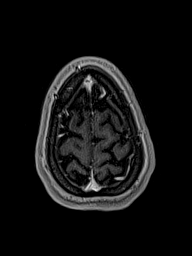
[im 160/160]
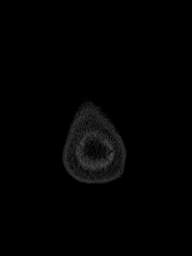

[Series 17: T1 post-contrast · coronal · 5.0mm · 0.43mm/px · 2 of 30 slices shown (2 of 3)]
[im 1/30]
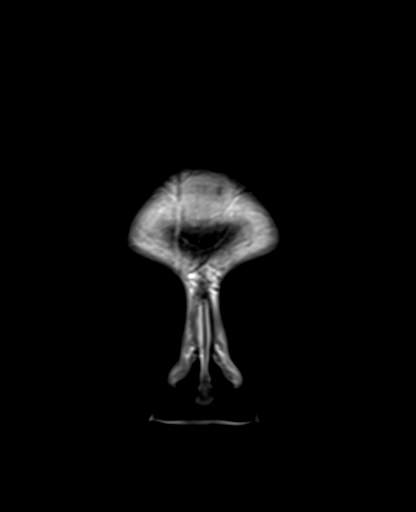
[im 30/30]
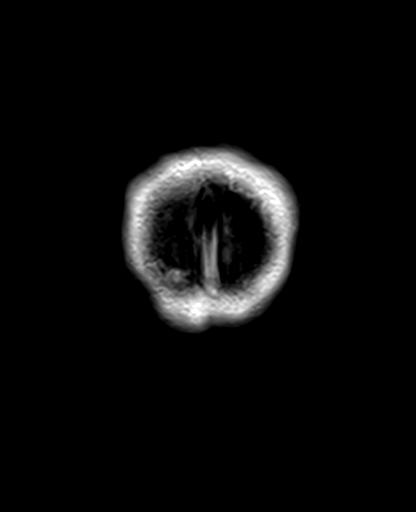

[Series 18: T1 post-contrast · sagittal · 5.0mm · 0.75mm/px · 1 of 24 slices shown (3 of 3)]
[im 1/24]
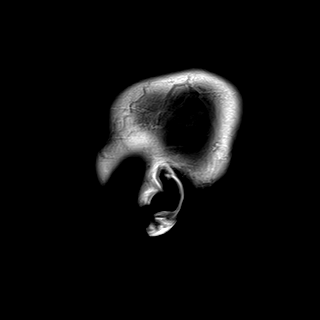

[48 of 48 positions shown; findings below may reference images not displayed]

FINDINGS: Mildly motion limited study.

Brain: Punctate focus of restricted diffusion in the right occipital
lobe (series 5, image 73; series 6, image 19) with slightt edema
(series 11, image 18). Punctate focus of restricted diffusion along
the parieto-occipital sulcus on the right (series 5, image 85) with
possible slight edema (series 11, image 30). Punctate focus of
apparent restricted diffusion in the right parietal cortex (series
5, image 90) with slight associated edema (series 11, image 35).
Additional faint punctate focus of DWI hyperintensity in the right
frontal cortex (series 5, image 93). Possible additional tiny foci
of restricted diffusion along bilateral perirolandic cortex (series
5, image 96) small remote lacunar infarct in the right cerebellum
(series 8, image 7) and right caudate (series 8, image 16). Possible
tiny foci of restricted diffusion in the right greater than left
perirolandic cortex and left frontal cortex (series 5 image [DATE]
represent additional punctate infarcts or artifact. No acute
hemorrhage, mass lesion, midline shift, extra-axial fluid
collection, or hydrocephalus.

Vascular: Major arterial flow voids are maintained.

Skull and upper cervical spine: Normal marrow signal.

Sinuses/Orbits: Sinuses are largely clear.  Unremarkable orbits.

Other: No mastoid effusions.
IMPRESSION: 1. Multiple punctate foci of restricted diffusion in the right
occipital, right (and possibly left) parietal and frontal cortex,
described above and suspicious for acute or subacute punctate
infarcts (over artifact) given suggestion of slight edema in some of
these areas. Given the potential involvement of multiple vascular
territories, consider a central embolic etiology. No significant
mass effect.
2. Small remote lacunar infarcts in the right cerebellum and right
caudate.
